# Patient Record
Sex: Female | Born: 1950 | Race: White | Hispanic: No | Smoking: Current every day smoker
Health system: Southern US, Community
[De-identification: ages and names within clinical notes are randomized; demographics above are authoritative.]

## PROBLEM LIST (undated history)

## (undated) DIAGNOSIS — E785 Hyperlipidemia, unspecified: Secondary | ICD-10-CM

## (undated) DIAGNOSIS — I10 Essential (primary) hypertension: Secondary | ICD-10-CM

## (undated) DIAGNOSIS — D369 Benign neoplasm, unspecified site: Secondary | ICD-10-CM

## (undated) DIAGNOSIS — Z808 Family history of malignant neoplasm of other organs or systems: Secondary | ICD-10-CM

## (undated) DIAGNOSIS — Z800 Family history of malignant neoplasm of digestive organs: Secondary | ICD-10-CM

## (undated) DIAGNOSIS — Z86010 Personal history of colonic polyps: Secondary | ICD-10-CM

## (undated) DIAGNOSIS — Z803 Family history of malignant neoplasm of breast: Secondary | ICD-10-CM

## (undated) DIAGNOSIS — E0590 Thyrotoxicosis, unspecified without thyrotoxic crisis or storm: Secondary | ICD-10-CM

## (undated) HISTORY — PX: COLONOSCOPY W/ POLYPECTOMY: SHX1380

## (undated) HISTORY — DX: Personal history of colonic polyps: Z86.010

## (undated) HISTORY — DX: Essential (primary) hypertension: I10

## (undated) HISTORY — DX: Family history of malignant neoplasm of digestive organs: Z80.0

## (undated) HISTORY — PX: WISDOM TOOTH EXTRACTION: SHX21

## (undated) HISTORY — DX: Hyperlipidemia, unspecified: E78.5

## (undated) HISTORY — DX: Benign neoplasm, unspecified site: D36.9

## (undated) HISTORY — PX: WRIST FRACTURE SURGERY: SHX121

## (undated) HISTORY — DX: Family history of malignant neoplasm of other organs or systems: Z80.8

## (undated) HISTORY — DX: Family history of malignant neoplasm of breast: Z80.3

## (undated) HISTORY — DX: Thyrotoxicosis, unspecified without thyrotoxic crisis or storm: E05.90

---
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
## 1993-06-13 HISTORY — PX: VAGINAL HYSTERECTOMY: SUR661

## 1999-02-16 ENCOUNTER — Other Ambulatory Visit: Admission: RE | Admit: 1999-02-16 | Discharge: 1999-02-16 | Payer: Self-pay | Admitting: Obstetrics and Gynecology

## 2000-04-11 ENCOUNTER — Other Ambulatory Visit: Admission: RE | Admit: 2000-04-11 | Discharge: 2000-04-11 | Payer: Self-pay | Admitting: Obstetrics and Gynecology

## 2001-04-27 ENCOUNTER — Other Ambulatory Visit: Admission: RE | Admit: 2001-04-27 | Discharge: 2001-04-27 | Payer: Self-pay | Admitting: Obstetrics and Gynecology

## 2001-05-15 ENCOUNTER — Ambulatory Visit (HOSPITAL_COMMUNITY): Admission: RE | Admit: 2001-05-15 | Discharge: 2001-05-15 | Payer: Self-pay | Admitting: Obstetrics and Gynecology

## 2001-05-15 ENCOUNTER — Encounter: Payer: Self-pay | Admitting: Obstetrics and Gynecology

## 2002-04-29 ENCOUNTER — Other Ambulatory Visit: Admission: RE | Admit: 2002-04-29 | Discharge: 2002-04-29 | Payer: Self-pay | Admitting: Obstetrics and Gynecology

## 2002-05-24 ENCOUNTER — Encounter: Payer: Self-pay | Admitting: Obstetrics and Gynecology

## 2002-05-24 ENCOUNTER — Ambulatory Visit (HOSPITAL_COMMUNITY): Admission: RE | Admit: 2002-05-24 | Discharge: 2002-05-24 | Payer: Self-pay | Admitting: Obstetrics and Gynecology

## 2003-05-28 ENCOUNTER — Other Ambulatory Visit: Admission: RE | Admit: 2003-05-28 | Discharge: 2003-05-28 | Payer: Self-pay | Admitting: Obstetrics and Gynecology

## 2004-05-27 ENCOUNTER — Other Ambulatory Visit: Admission: RE | Admit: 2004-05-27 | Discharge: 2004-05-27 | Payer: Self-pay | Admitting: Obstetrics and Gynecology

## 2005-06-02 ENCOUNTER — Other Ambulatory Visit: Admission: RE | Admit: 2005-06-02 | Discharge: 2005-06-02 | Payer: Self-pay | Admitting: Obstetrics and Gynecology

## 2009-08-25 ENCOUNTER — Encounter (INDEPENDENT_AMBULATORY_CARE_PROVIDER_SITE_OTHER): Payer: Self-pay | Admitting: *Deleted

## 2009-08-26 ENCOUNTER — Telehealth: Payer: Self-pay | Admitting: Internal Medicine

## 2009-08-26 ENCOUNTER — Ambulatory Visit: Payer: Self-pay | Admitting: Internal Medicine

## 2009-09-08 ENCOUNTER — Ambulatory Visit: Payer: Self-pay | Admitting: Internal Medicine

## 2009-09-14 ENCOUNTER — Encounter: Payer: Self-pay | Admitting: Internal Medicine

## 2009-11-30 ENCOUNTER — Encounter (HOSPITAL_COMMUNITY): Admission: RE | Admit: 2009-11-30 | Discharge: 2010-02-16 | Payer: Self-pay | Admitting: Endocrinology

## 2010-07-04 ENCOUNTER — Encounter: Payer: Self-pay | Admitting: Endocrinology

## 2010-07-15 NOTE — Progress Notes (Signed)
Summary: Reglan given waith Moviprep  Dr. Leone Payor, I ordered [name] 2 Reglan 10mg  in case she needed them with her Movi Prep.  She wanted OsmoPrep but decided to try the Moviprep due to the FDA warning.  Dawn Hopkins

## 2010-07-15 NOTE — Miscellaneous (Signed)
Summary: [facility] Previsit/prep  Clinical Lists Changes  Medications: Added new medication of MOVIPREP 100 GM  SOLR (PEG-KCL-NACL-NASULF-NA ASC-C) As per prep instructions. - Signed Added new medication of METOCLOPRAMIDE HCL 10 MG  TABS (METOCLOPRAMIDE HCL) As per prep instructions. - Signed Rx of MOVIPREP 100 GM  SOLR (PEG-KCL-NACL-NASULF-NA ASC-C) As per prep instructions.;  #1 x 0;  Signed;  Entered by: Wyona Almas RN;  Authorized by: Iva Boop MD, [facility];  Method used: Electronically to [facility] [address] [place], [place], [place]  [address], Ph: [phone], Fax: [phone] Rx of METOCLOPRAMIDE HCL 10 MG  TABS (METOCLOPRAMIDE HCL) As per prep instructions.;  #2 x 0;  Signed;  Entered by: Wyona Almas RN;  Authorized by: Iva Boop MD, [facility];  Method used: Electronically to [facility] [address] [place], [place], [place]  [address], Ph: [phone], Fax: [phone] Observations: Added new observation of NKA: T (08/26/2009 16:05)    Prescriptions: METOCLOPRAMIDE HCL 10 MG  TABS (METOCLOPRAMIDE HCL) As per prep instructions.  #2 x 0   Entered by:   Wyona Almas RN   Authorized by:   Iva Boop MD, [facility]   Signed by:   Wyona Almas RN on 08/26/2009   Method used:   Electronically to        [facility]* (retail)       [address] [place], [place]  [address]       Ph: [phone]       Fax: [phone]   RxID:   [id number] MOVIPREP 100 GM  SOLR (PEG-KCL-NACL-NASULF-NA ASC-C) As per prep instructions.  #1 x 0   Entered by:   Wyona Almas RN   Authorized by:   Iva Boop MD, [facility]   Signed by:   Wyona Almas RN on 08/26/2009   Method used:   Electronically to        [facility]* (retail)       [address] [place], [place]  [address]       Ph: [phone]       Fax: [phone]   RxID:   [id number]

## 2010-07-15 NOTE — Letter (Signed)
Summary: Patient Notice- Colon Biospy Results  [facility]  [address] [place], [place] [address]   Phone: [phone]  Fax: [phone]        September 14, 2009 MRN: [id number]    Dawn Hopkins [address] [place], [place]  [address]    Dear [name],  I am pleased to inform you that the biopsies taken during your recent colonoscopy did not show any evidence of cancer upon pathologic examination.  You had one tiny polyp that was removed and was hyperplastic, having no significant cancer risk.  There was a suspected polyp in the right side of your colon also classified as a hyperplastic polyp. It is thought that hyperplastic polyps on this side of the colon may have cancer risk over time. I was not able to remove that polyp.    I would like to see you in the office in one year so that may discuss options for follow-up of this polyp that remains. You have had polyps like this before and so far you have been without colon cancer, so I do not think we need to repeat a colonoscopy right now as the significance and need to remove this poly is actually unclear.  We will place you on our list to call for an appointment in about one year and I advise you to save this letter and mark your 2012 calendar to remember to see me in April 2012.  Please call us if you are having persistent problems or have questions about your condition that have not been fully answered at this time.  Sincerely,  Iva Boop MD, [facility]   This letter has been electronically signed by your physician.  Appended Document: Patient Notice- Colon Biospy Results Letter mailed 4.5.11

## 2010-07-15 NOTE — Procedures (Signed)
Summary: Colonoscopy  Patient: [name] Note: All result statuses are Final unless otherwise noted.  Tests: (1) Colonoscopy (COL)   COL Colonoscopy           DONE     [facility]     [address]     [place], [place]  [address]           COLONOSCOPY PROCEDURE REPORT           PATIENT:  Dawn Hopkins, Dawn Hopkins  MR#:  [id number]     BIRTHDATE:  1950/07/01, 59 yrs. old  GENDER:  female     ENDOSCOPIST:  Iva Boop, MD, [facility]     REF. 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  Digital rectal exam was performed and     revealed no abnormalities.   The LB CF-[id number] endoscope     was introduced through the anus and advanced to the cecum, which     was identified by both the appendix and ileocecal valve, without     limitations.  The quality of the prep was good, using MoviPrep.     The instrument was then slowly withdrawn as the colon was fully     examined. Insertion: 7:04 minutes withdrawal: 15:32 minutes     <<PROCEDUREIMAGES>>           FINDINGS:  A diminutive polyp was found in the sigmoid colon. It     was 5 mm in size. Polyp was snared without cautery. Retrieval was     successful. s Abnormal appearing mucosa in the mid transverse     colon. ? polyp, flat up to 15 mm on a fold, unable to snare so     biopsied Multiple biopsies were obtained and sent to pathology.     Scattered diverticula were found in the sigmoid colon.  This was     otherwise a normal examination of the  colon.   Retroflexed views     in the rectum revealed internal and external hemorrhoids.    The     scope was then withdrawn from the patient and the procedure     completed.           COMPLICATIONS:  None     ENDOSCOPIC IMPRESSION:     1) 5 mm diminutive polyp in the sigmoid colon - removed     2) Abnormal mucosa in the mid transverse colon - ? flat polyp -     biopsied     3) Diverticula, scattered in the sigmoid colon     4) Internal and external hemorrhoids     5) Otherwise normal examination           6) prior adenomas 2004 and 2005           REPEAT  EXAM:  In for Colonoscopy, pending biopsy results.           Iva Boop, MD, [facility]           CC:  Montey Hora, MD     Harold Hedge, MD     The Patient           n.     Rosalie Doctor:   Iva Boop at 09/08/2009 02:22 PM           Aurea Graff, [id number]  Note: An exclamation mark (!) indicates a result that was not dispersed into the flowsheet. Document Creation Date: 09/08/2009 2:23 PM _______________________________________________________________________  (1) Order result status: Final Collection or observation date-time: 09/08/2009 14:12 Requested date-time:  Receipt date-time:  Reported date-time:  Referring Physician:   Ordering Physician: Stan Head [phone]) Specimen Source:  Source: Launa Grill Order Number: [id number] Lab site:   Appended Document: Colonoscopy     Procedures Next Due Date:    Colonoscopy: 09/2010

## 2010-07-15 NOTE — Letter (Signed)
Summary: [facility] Instructions  [facility]  [address] [place], [place] [address]   Phone: [phone]  Fax: [phone]       Dawn Hopkins    01/16/1951    MRN: [id number]        Procedure Day Dorna Bloom:  [name]  09/08/09     Arrival Time:  12:30PM     Procedure Time:  1:30PM     Location of Procedure:                    _ X_  [facility] (4th Floor)                        PREPARATION FOR COLONOSCOPY WITH MOVIPREP   Starting 5 days prior to your procedure 09/03/09 do not eat nuts, seeds, popcorn, corn, beans, peas,  salads, or any raw vegetables.  Do not take any fiber supplements (e.g. Metamucil, Citrucel, and Benefiber).  THE DAY BEFORE YOUR PROCEDURE         DATE: 09/07/09  DAY: MONDAY  1.  Drink clear liquids the entire day-NO SOLID FOOD  2.  Do not drink anything colored red or purple.  Avoid juices with pulp.  No orange juice.  3.  Drink at least 64 oz. (8 glasses) of fluid/clear liquids during the day to prevent dehydration and help the prep work efficiently.  CLEAR LIQUIDS INCLUDE: Water Jello Ice Popsicles Tea (sugar ok, no milk/cream) Powdered fruit flavored drinks Coffee (sugar ok, no milk/cream) Gatorade Juice: apple, white grape, white cranberry  Lemonade Clear bullion, consomm, broth Carbonated beverages (any kind) Strained chicken noodle soup Hard Candy                             4.  In the morning, mix first dose of MoviPrep solution:    Empty 1 Pouch A and 1 Pouch B into the disposable container    Add lukewarm drinking water to the top line of the container. Mix to dissolve    Refrigerate (mixed solution should be used within 24 hrs)  Optional:  Take one Metacloprimide/Reglan at 4:30 PM to prevent nausea.  5.  Begin drinking the prep at 5:00 p.m. The MoviPrep container is divided by 4 marks.   Every 15 minutes drink the solution down to the next mark (approximately 8 oz) until the full liter is complete.     6.  Follow completed prep with 16 oz of clear liquid of your choice (Nothing red or purple).  Continue to drink clear liquids until bedtime.  7.  Before going to bed, mix second dose of MoviPrep solution:    Empty 1 Pouch A and 1 Pouch B into the disposable container    Add lukewarm drinking water to the top line of the container. Mix to dissolve    Refrigerate  THE DAY OF YOUR PROCEDURE      DATE: 09/08/09  DAY: TUESDAY  Optional:  Take one Metacloprimide/Reglan at 8:00AM to prevent nausea.  Beginning at 8:30a.m. (5 hours before procedure):         1. Every 15 minutes, drink the solution down to the next mark (approx 8 oz) until the full liter is complete.  2. Follow completed prep with 16 oz. of clear liquid of your choice.    3. You may drink clear liquids until 11:30AM (2 HOURS BEFORE PROCEDURE).   MEDICATION INSTRUCTIONS  Unless otherwise instructed, you should take regular prescription medications with a small sip of water   as early as possible the morning of your procedure.         OTHER INSTRUCTIONS  You will need a responsible adult at least 60 years of age to accompany you and drive you home.   This person must remain in the waiting room during your procedure.  Wear loose fitting clothing that is easily removed.  Leave jewelry and other valuables at home.  However, you may wish to bring a book to read or  an iPod/MP3 player to listen to music as you wait for your procedure to start.  Remove all body piercing jewelry and leave at home.  Total time from sign-in until discharge is approximately 2-3 hours.  You should go home directly after your procedure and rest.  You can resume normal activities the  day after your procedure.  The day of your procedure you should not:   Drive   Make legal decisions   Operate machinery   Drink alcohol   Return to work  You will receive specific instructions about eating, activities and medications before you  leave.    The above instructions have been reviewed and explained to me by   Wyona Almas RN  August 26, 2009 4:52 PM     I fully understand and can verbalize these instructions _____________________________ Date _________

## 2010-08-26 ENCOUNTER — Encounter: Payer: Self-pay | Admitting: Internal Medicine

## 2010-08-31 NOTE — Letter (Signed)
Summary: Colonoscopy-Changed to Office Visit Letter   [facility]  [address]   [place], [place] [address]   Phone: [phone]  Fax: [phone]      August 26, 2010 MRN: [id number]   Dawn Hopkins [address] [place], [place]  [address]   Dear [name],   According to our records, it is time for you to schedule a Colonoscopy. However, after reviewing your medical record, I feel that an office visit would be most appropriate to more completely evaluate you and determine your need for a repeat procedure.  Please call [phone] (option #2) at your convenience to schedule an office visit. If you have any questions, concerns, or feel that this letter is in error, we would appreciate your call.   Sincerely,   Stan Head, M.D.   [facility] Gastroenterology Division [phone]

## 2010-11-26 ENCOUNTER — Ambulatory Visit (INDEPENDENT_AMBULATORY_CARE_PROVIDER_SITE_OTHER): Payer: BC Managed Care – PPO | Admitting: Internal Medicine

## 2010-11-26 ENCOUNTER — Encounter: Payer: Self-pay | Admitting: Internal Medicine

## 2010-11-26 DIAGNOSIS — D1391 Familial adenomatous polyposis: Secondary | ICD-10-CM | POA: Insufficient documentation

## 2010-11-26 DIAGNOSIS — Z860100 Personal history of colon polyps, unspecified: Secondary | ICD-10-CM

## 2010-11-26 DIAGNOSIS — D126 Benign neoplasm of colon, unspecified: Secondary | ICD-10-CM

## 2010-11-26 HISTORY — DX: Benign neoplasm of colon, unspecified: D12.6

## 2010-11-26 HISTORY — DX: Personal history of colonic polyps: Z86.010

## 2010-11-26 HISTORY — DX: Personal history of colon polyps, unspecified: Z86.0100

## 2010-11-26 NOTE — Progress Notes (Signed)
This is a 60 year old white woman that has had several colonoscopies and polypectomies. On the last one, approximately a year ago there was a suspected 15 mm flat polyp in the right colon. The pathology from biopsies came back as a hyperplastic polyp. It was not entirely clear this was a true polyp but it might have been. I was unable to snare it. I had asked her to come back in about a year to discuss followup. She is inclined to wait as long as I think she can before she has a followup colonoscopy for this. She has no active GI complaints. She is monitoring her thyroid, switching endocrinologists at this time, while she awaits starting Synthroid after radioactive ablation of her thyroid for hyperthyroidism.

## 2010-11-26 NOTE — Patient Instructions (Signed)
This office will plan to send you a reminder letter in October 2012 about having a colonoscopy to follow-up on the polyp in question as we discussed. Iva Boop, MD, [facility]

## 2010-11-26 NOTE — Assessment & Plan Note (Addendum)
I explained as best I could help she had what is possibly a large hyperplastic polyp in the right colon that I could not remove last year. Timing of followup is not entirely clear. She is following up after radioactive iodine ablation of her thyroid and is switching endocrinologists and is about to start Synthroid she thinks. We agreed to put this off for a few months at least, I think that's fine from a medical standpoint and we will send her a recall letter in October. Would plan for a colonoscopy at the hospital were argon plasma coagulator is available. I did explain that sometimes there is this interpretation this may not really even be a true polyp but need to follow-up. She has had TV adenoma of cecum and a rectal adenoma previously.

## 2010-11-29 ENCOUNTER — Encounter: Payer: Self-pay | Admitting: Internal Medicine

## 2011-01-19 ENCOUNTER — Telehealth: Payer: Self-pay | Admitting: Gastroenterology

## 2011-01-19 NOTE — Telephone Encounter (Signed)
Message copied by Bernita Buffy on Wed Jan 19, 2011  8:52 AM ------      Message from: Iva Boop      Created: Tue Jan 18, 2011  9:31 PM      Regarding: recall       Please double check her colon recall date and let me know

## 2011-01-19 NOTE — Telephone Encounter (Signed)
Colon recall date is October 2012.

## 2011-06-16 ENCOUNTER — Other Ambulatory Visit: Payer: Self-pay | Admitting: Dermatology

## 2011-07-22 ENCOUNTER — Telehealth: Payer: Self-pay | Admitting: Internal Medicine

## 2011-07-22 NOTE — Telephone Encounter (Signed)
Per the June office note she was to be in for a colon recall with [facility] for large hyperplastic polyp.  She still does not want this scheduled at this time, but has asked that I send her a letter now so she can have it to remind herself to call and schedule for late Spring or early summer.  I will send the recall letter today.  Dr Leone Payor looks like the recall was not sent in October.  See phone note from 01/19/12.  I encouraged the patient to schedule for Feb hospital week, "I am not ready to do that anytime soon".  Recall letter sent today.

## 2011-09-01 ENCOUNTER — Encounter: Payer: Self-pay | Admitting: Internal Medicine

## 2011-09-01 NOTE — Telephone Encounter (Signed)
This encounter was created in error - please disregard.

## 2011-11-30 ENCOUNTER — Telehealth: Payer: Self-pay

## 2011-11-30 NOTE — Telephone Encounter (Signed)
Message copied by Annett Fabian on Wed Nov 30, 2011  9:27 AM ------      Message from: Iva Boop      Created: Tue Nov 29, 2011  3:40 PM      Regarding: still needs colonoscopy       Please contact her by phone about scheduling follow-up colonoscopy (hospital with [facility] available)

## 2011-11-30 NOTE — Telephone Encounter (Signed)
Left message for patient to call back to discuss colon at [facility] with [facility]

## 2011-12-01 NOTE — Telephone Encounter (Signed)
I have attempted to reach the patient and there is no return call from the patient.  I have left her another message and mailed her a letters asking her to call me to discuss and schedule

## 2011-12-13 ENCOUNTER — Telehealth: Payer: Self-pay | Admitting: Internal Medicine

## 2011-12-13 NOTE — Telephone Encounter (Signed)
Left message for patient to call back to discuss scheduling a colonoscopy with [facility] @ [facility] for the week of 02/06/12-02/09/12

## 2011-12-14 ENCOUNTER — Other Ambulatory Visit: Payer: Self-pay

## 2011-12-14 NOTE — Telephone Encounter (Signed)
Patient is scheduled for colon with [facility] @ [facility] for 02/08/12 0830.  She will come for a pre-visit on 01/25/12 4:30

## 2012-01-17 ENCOUNTER — Other Ambulatory Visit: Payer: Self-pay

## 2012-01-25 ENCOUNTER — Encounter: Payer: Self-pay | Admitting: Internal Medicine

## 2012-01-25 ENCOUNTER — Ambulatory Visit (AMBULATORY_SURGERY_CENTER): Payer: 59 | Admitting: *Deleted

## 2012-01-25 VITALS — Ht 64.0 in | Wt 160.9 lb

## 2012-01-25 DIAGNOSIS — Z1211 Encounter for screening for malignant neoplasm of colon: Secondary | ICD-10-CM

## 2012-01-25 DIAGNOSIS — Z860100 Personal history of colon polyps, unspecified: Secondary | ICD-10-CM

## 2012-01-25 MED ORDER — MOVIPREP 100 G PO SOLR: ORAL | Status: DC

## 2012-02-08 ENCOUNTER — Encounter (HOSPITAL_COMMUNITY)
Admission: RE | Disposition: A | Discharge status: Home / Self Care | Payer: Self-pay | Source: Ambulatory Visit | Attending: Internal Medicine

## 2012-02-08 ENCOUNTER — Encounter (HOSPITAL_COMMUNITY): Payer: Self-pay | Admitting: *Deleted

## 2012-02-08 ENCOUNTER — Ambulatory Visit (HOSPITAL_COMMUNITY)
Admission: RE | Admit: 2012-02-08 | Discharge: 2012-02-08 | Disposition: A | Discharge status: Home / Self Care | Payer: 59 | Source: Ambulatory Visit | Attending: Internal Medicine | Admitting: Internal Medicine

## 2012-02-08 DIAGNOSIS — F17200 Nicotine dependence, unspecified, uncomplicated: Secondary | ICD-10-CM | POA: Insufficient documentation

## 2012-02-08 DIAGNOSIS — Z86010 Personal history of colonic polyps: Secondary | ICD-10-CM

## 2012-02-08 DIAGNOSIS — Z09 Encounter for follow-up examination after completed treatment for conditions other than malignant neoplasm: Secondary | ICD-10-CM | POA: Insufficient documentation

## 2012-02-08 DIAGNOSIS — K5730 Diverticulosis of large intestine without perforation or abscess without bleeding: Secondary | ICD-10-CM | POA: Insufficient documentation

## 2012-02-08 DIAGNOSIS — Z1211 Encounter for screening for malignant neoplasm of colon: Secondary | ICD-10-CM

## 2012-02-08 DIAGNOSIS — D126 Benign neoplasm of colon, unspecified: Secondary | ICD-10-CM | POA: Insufficient documentation

## 2012-02-08 HISTORY — PX: COLONOSCOPY: SHX5424

## 2012-02-08 SURGERY — COLONOSCOPY
Anesthesia: Moderate Sedation

## 2012-02-08 MED ORDER — FENTANYL CITRATE 0.05 MG/ML IJ SOLN
INTRAMUSCULAR | Status: DC | PRN
  Administered 2012-02-08 (×3): 25 ug via INTRAVENOUS

## 2012-02-08 MED ORDER — MIDAZOLAM HCL 10 MG/2ML IJ SOLN
INTRAMUSCULAR | Status: DC | PRN
  Administered 2012-02-08 (×3): 2 mg via INTRAVENOUS

## 2012-02-08 MED ORDER — DIPHENHYDRAMINE HCL 50 MG/ML IJ SOLN
INTRAMUSCULAR | Status: AC
  Filled 2012-02-08: qty 1

## 2012-02-08 MED ORDER — FENTANYL CITRATE 0.05 MG/ML IJ SOLN
INTRAMUSCULAR | Status: AC
  Filled 2012-02-08: qty 4

## 2012-02-08 MED ORDER — SODIUM CHLORIDE 0.9 % IV SOLN
INTRAVENOUS | Status: DC
  Administered 2012-02-08: 500 mL via INTRAVENOUS

## 2012-02-08 MED ORDER — MIDAZOLAM HCL 10 MG/2ML IJ SOLN
INTRAMUSCULAR | Status: AC
  Filled 2012-02-08: qty 4

## 2012-02-08 NOTE — H&P (Signed)
  Cc: Hx colon polyps  HPI:  No symptoms Here for colon poly follow-up  No Known Allergies @[facility] @ Past Medical History  Diagnosis Date  . Adenomatous polyps 11/2002, 8, 2005  . 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 Vaginal hysterectomy   . Colonoscopy w/ polypectomy 6/04, 8/05, 3/11    1cm TV adenoma cecum; 7 mm rectal adenoma; large hyperplastic polyp not removed from transverse  . Tonsillectomy and adenoidectomy   . Wisdom tooth extraction    History   Social History  . Marital Status: Divorced    Spouse Name: N/A    Number of Children: N/A  . Years of Education: N/A   Social History Main Topics  . Smoking status: Current Everyday Smoker -- 1.0 packs/day    Types: Cigarettes  . Smokeless tobacco: Never Used  . Alcohol Use: Yes     rare  . Drug Use: No  . Sexually Active: None   Other Topics Concern  . None   Social History Narrative  . None   Family History  Problem Relation Age of Onset  . Breast cancer Mother   . Alzheimer's disease Father   . Colon cancer Neg Hx   . Stomach cancer Neg Hx       PE:  General:  NAD Eyes:   anicteric Lungs:  clear Heart:  S1S2 no rubs, murmurs or gallops Abdomen:  soft and nontender, BS+   Ass/Plan  Hx colon polyps  For colonoscopy  .

## 2012-02-08 NOTE — Op Note (Signed)
[facility] [address] [place], [address]   COLONOSCOPY PROCEDURE REPORT  PATIENT: Dawn Hopkins, Dawn Hopkins  MR#: [id number] BIRTHDATE: February 02, 1951 , 61  yrs. old GENDER: Female ENDOSCOPIST: Iva Boop, MD, [facility] REFERRED BY: PROCEDURE DATE:  02/08/2012 PROCEDURE:   Colonoscopy with cold biopsy polypectomy ASA CLASS:   Class I INDICATIONS:screening and surveillance,personal history of colonic polyps. MEDICATIONS: Fentanyl 75 mcg IV and Versed 6 mg IV  DESCRIPTION OF PROCEDURE:   After the risks benefits and alternatives of the procedure were thoroughly explained, informed consent was obtained.  A digital rectal exam revealed no abnormalities of the rectum.   The     endoscope was introduced through the anus and advanced to the cecum, which was identified by both the appendix and ileocecal valve. No adverse events experienced.   The quality of the prep was excellent, using MoviPrep  The instrument was then slowly withdrawn as the colon was fully examined.      COLON FINDINGS: Three diminutive sessile polyps were found at the splenic flexure.  A polypectomy was performed with cold forceps. The resection was complete and the polyp tissue was completely retrieved.   A sessile polyp was found in the descending colon.  A polypectomy was performed with a cold snare.  The resection was complete and the polyp tissue was completely retrieved.   A sessile polyp measuring 3 mm in size was found in the rectum.  A polypectomy was performed with cold forceps.  The resection was complete and the polyp tissue was completely retrieved.   Mild diverticulosis was noted in the sigmoid colon.   The colon mucosa was otherwise normal.   Hypertrophic anal papilla was found in the anal canal.  Retroflexed views in right colon and rectum revealed no abnormalities. The time to cecum=5 minutes 0 seconds. Withdrawal time=7 minutes 0 seconds.  The scope was withdrawn and the  procedure completed. COMPLICATIONS: There were no complications.  ENDOSCOPIC IMPRESSION: 1.   Three diminutive sessile polyps were found at the splenic flexure; polypectomy was performed with cold forceps 2.   Sessile polyp was found in the descending colon; polypectomy was performed with a cold snare 3.   Sessile polyp measuring 3 mm in size was found in the rectum; polypectomy was performed with cold forceps 4.   Mild diverticulosis was noted in the sigmoid colon 5.   The colon mucosa was otherwise normal 6.   Hypertrophic anal papilla in the anal canal  RECOMMENDATIONS: Timing of repeat colonoscopy will be determined by pathology findings and her personal history of colon polyps.   eSigned:  Iva Boop, MD, [facility] 02/08/2012 9:28 AM   cc: Harold Hedge, MD and The Patient   PATIENT NAME:  Alyia, Lacerte MR#: [id number]

## 2012-02-09 ENCOUNTER — Encounter (HOSPITAL_COMMUNITY): Payer: Self-pay

## 2012-02-09 ENCOUNTER — Encounter (HOSPITAL_COMMUNITY): Payer: Self-pay | Admitting: Internal Medicine

## 2012-02-12 ENCOUNTER — Encounter: Payer: Self-pay | Admitting: Internal Medicine

## 2012-02-12 NOTE — Progress Notes (Signed)
Quick Note:  5 adenomas - repeat colonoscopy 3 years 02/2015 ______

## 2012-08-03 ENCOUNTER — Telehealth: Payer: Self-pay | Admitting: Internal Medicine

## 2012-08-03 ENCOUNTER — Encounter: Payer: Self-pay | Admitting: Gastroenterology

## 2012-08-03 ENCOUNTER — Ambulatory Visit (INDEPENDENT_AMBULATORY_CARE_PROVIDER_SITE_OTHER): Payer: 59 | Admitting: Gastroenterology

## 2012-08-03 VITALS — BP 120/84 | HR 84 | Ht 63.78 in | Wt 165.4 lb

## 2012-08-03 DIAGNOSIS — K625 Hemorrhage of anus and rectum: Secondary | ICD-10-CM | POA: Insufficient documentation

## 2012-08-03 MED ORDER — HYDROCORTISONE ACETATE 25 MG RE SUPP: RECTAL | Status: DC

## 2012-08-03 NOTE — Patient Instructions (Addendum)
You have been given a separate informational sheet regarding your tobacco use, the importance of quitting and local resources to help you quit.  We sent a prescription for the [facility] suppositories to [facility].  Call us if your symptoms worsen or you have any other problems.

## 2012-08-03 NOTE — Progress Notes (Signed)
08/03/2012 Dawn Hopkins [id number] 1950-11-21   History of Present Illness: Patient is a pleasant 62 year old female who is a patient of Dr. Marvell Fuller.  She presents to our office today for complaints of rectal bleeding.  Had a BM last night with BRB in toilet bowl and on TP.  No abdominal pain or rectal discomfort.  No blood or BM overnight, but had another BM this AM with some blood as well.  Last colonoscopy was 01/2012 at which time she was found to have 5 adenomas, which were removed.  Recommended repeat colonoscopy in 3 years.  Mild diverticulosis in the sigmoid colon and hypertrophic anal papilla in the anal canal were also noted.  Current Medications, Allergies, Past Medical History, Past Surgical History, Family History and Social History were reviewed in [employer] record.   Physical Exam: BP 120/84  Pulse 84  Ht 5' 3.78" (1.62 m)  Wt 165 lb 6 oz (75.014 kg)  BMI 28.58 kg/m2 General: Well developed, white female in no acute distress Head: Normocephalic and atraumatic Eyes:  sclerae anicteric, conjunctiva pink  Ears: Normal auditory acuity Lungs: Clear throughout to auscultation Heart: Regular rate and rhythm Abdomen: Soft, non-tender and non-distended. No masses, no hepatomegaly. Normal bowel sounds. Rectal: External hemorrhoid seen without bleeding.  DRE revealed soft stool in rectal vault; no definitive internal hemorrhoid felt.  Blood on exam glove darker in color. Musculoskeletal: Symmetrical with no gross deformities  Extremities: No edema  Neurological: Alert oriented x 4, grossly nonfocal Psychological:  Alert and cooperative. Normal mood and affect  Assessment and Recommendations: -Rectal bleeding:  Likely secondary to internal hemorrhoid vs small volume diverticular bleed.  Will give anusol suppositories to use at bedtime.  Call if symptoms worsen or other issues occur.

## 2012-08-03 NOTE — Telephone Encounter (Signed)
Patient with two episodes of moderate amount of bright red blood with a BM.  She denies abdominal pain, fever, or other GI complaints today.  She will come in for evaluation with Doug Sou, PA at 3:00 today.

## 2012-08-08 NOTE — Progress Notes (Signed)
Agree with Dawn Hopkins's management. 

## 2013-10-25 ENCOUNTER — Other Ambulatory Visit: Payer: Self-pay | Admitting: Physician Assistant

## 2014-02-13 ENCOUNTER — Encounter: Payer: Self-pay | Admitting: Internal Medicine

## 2014-10-16 ENCOUNTER — Other Ambulatory Visit (HOSPITAL_COMMUNITY): Payer: Self-pay | Admitting: Obstetrics and Gynecology

## 2014-10-17 LAB — CYTOLOGY - PAP

## 2014-12-16 ENCOUNTER — Telehealth: Payer: Self-pay | Admitting: Internal Medicine

## 2014-12-16 NOTE — Telephone Encounter (Signed)
Patient is asking if her colonoscopy has to be scheduled at the hospital. (last one was done at hospital) Please, advise.

## 2014-12-17 NOTE — Telephone Encounter (Signed)
Spoke to pt she states she will call back to schedule in a week or two.

## 2014-12-17 NOTE — Telephone Encounter (Signed)
Ok to schedule pt for colon in the [facility] in September per Dr. Carlean Purl.

## 2014-12-17 NOTE — Telephone Encounter (Signed)
[facility] this time

## 2015-01-06 ENCOUNTER — Encounter: Payer: Self-pay | Admitting: Internal Medicine

## 2015-03-05 ENCOUNTER — Ambulatory Visit (AMBULATORY_SURGERY_CENTER): Payer: Self-pay | Admitting: *Deleted

## 2015-03-05 VITALS — Ht 65.0 in | Wt 166.2 lb

## 2015-03-05 DIAGNOSIS — Z86010 Personal history of colonic polyps: Secondary | ICD-10-CM

## 2015-03-05 NOTE — Progress Notes (Signed)
No allergies to eggs or soy. No problems with anesthesia.  Pt given Emmi instructions for colonoscopy  No oxygen use  No diet drug use  

## 2015-03-19 ENCOUNTER — Encounter: Payer: Self-pay | Admitting: Internal Medicine

## 2015-03-19 ENCOUNTER — Ambulatory Visit (AMBULATORY_SURGERY_CENTER): Payer: BLUE CROSS/BLUE SHIELD | Admitting: Internal Medicine

## 2015-03-19 VITALS — BP 127/110 | HR 52 | Temp 97.6°F | Resp 26 | Ht 65.0 in | Wt 166.0 lb

## 2015-03-19 DIAGNOSIS — Z86010 Personal history of colonic polyps: Secondary | ICD-10-CM | POA: Diagnosis not present

## 2015-03-19 DIAGNOSIS — D123 Benign neoplasm of transverse colon: Secondary | ICD-10-CM | POA: Diagnosis not present

## 2015-03-19 DIAGNOSIS — D124 Benign neoplasm of descending colon: Secondary | ICD-10-CM

## 2015-03-19 DIAGNOSIS — K635 Polyp of colon: Secondary | ICD-10-CM | POA: Diagnosis not present

## 2015-03-19 DIAGNOSIS — D122 Benign neoplasm of ascending colon: Secondary | ICD-10-CM | POA: Diagnosis not present

## 2015-03-19 MED ORDER — SODIUM CHLORIDE 0.9 % IV SOLN: 500.0000 mL | INTRAVENOUS | Status: DC

## 2015-03-19 NOTE — Patient Instructions (Addendum)
I found and removed 19 small polyps.  I will let you know results and when to have another colonoscopy by mail. It may be as soon as 1 year this time.  I appreciate the opportunity to care for you. Gatha Mayer, MD, [facility]  Discharge instructions given. Handouts on polyps and diverticulosis. Resume previous medications. YOU HAD AN ENDOSCOPIC PROCEDURE TODAY AT [facility]:   Refer to the procedure report that was given to you for any specific questions about what was found during the examination.  If the procedure report does not answer your questions, please call your gastroenterologist to clarify.  If you requested that your care partner not be given the details of your procedure findings, then the procedure report has been included in a sealed envelope for you to review at your convenience later.  YOU SHOULD EXPECT: Some feelings of bloating in the abdomen. Passage of more gas than usual.  Walking can help get rid of the air that was put into your GI tract during the procedure and reduce the bloating. If you had a lower endoscopy (such as a colonoscopy or flexible sigmoidoscopy) you may notice spotting of blood in your stool or on the toilet paper. If you underwent a bowel prep for your procedure, you may not have a normal bowel movement for a few days.  Please Note:  You might notice some irritation and congestion in your nose or some drainage.  This is from the oxygen used during your procedure.  There is no need for concern and it should clear up in a day or so.  SYMPTOMS TO REPORT IMMEDIATELY:   Following lower endoscopy (colonoscopy or flexible sigmoidoscopy):  Excessive amounts of blood in the stool  Significant tenderness or worsening of abdominal pains  Swelling of the abdomen that is new, acute  Fever of 100F or higher   For urgent or emergent issues, a gastroenterologist can be reached at any hour by calling [phone].   DIET: Your first meal  following the procedure should be a small meal and then it is ok to progress to your normal diet. Heavy or fried foods are harder to digest and may make you feel nauseous or bloated.  Likewise, meals heavy in dairy and vegetables can increase bloating.  Drink plenty of fluids but you should avoid alcoholic beverages for 24 hours.  ACTIVITY:  You should plan to take it easy for the rest of today and you should NOT DRIVE or use heavy machinery until tomorrow (because of the sedation medicines used during the test).    FOLLOW UP: Our staff will call the number listed on your records the next business day following your procedure to check on you and address any questions or concerns that you may have regarding the information given to you following your procedure. If we do not reach you, we will leave a message.  However, if you are feeling well and you are not experiencing any problems, there is no need to return our call.  We will assume that you have returned to your regular daily activities without incident.  If any biopsies were taken you will be contacted by phone or by letter within the next 1-3 weeks.  Please call us at [phone] if you have not heard about the biopsies in 3 weeks.    SIGNATURES/CONFIDENTIALITY: You and/or your care partner have signed paperwork which will be entered into your electronic medical record.  These signatures attest to  the fact that that the information above on your After Visit Summary has been reviewed and is understood.  Full responsibility of the confidentiality of this discharge information lies with you and/or your care-partner.

## 2015-03-19 NOTE — Progress Notes (Signed)
Called to room to assist during endoscopic procedure.  Patient ID and intended procedure confirmed with present staff. Received instructions for my participation in the procedure from the performing physician.  

## 2015-03-19 NOTE — Progress Notes (Signed)
Report to PACU, RN, vss, BBS= Clear.  

## 2015-03-19 NOTE — Op Note (Signed)
[place]  [address] [place], [id number]   COLONOSCOPY PROCEDURE REPORT  PATIENT: Dawn, Hopkins  MR#: [id number] BIRTHDATE: 1950-07-12 , 64  yrs. old GENDER: female ENDOSCOPIST: Gatha Mayer, MD, [facility] PROCEDURE DATE:  03/19/2015 PROCEDURE:   Colonoscopy, surveillance , Colonoscopy with biopsy, and Colonoscopy with snare polypectomy Prior Negative Screening - Now for repeat screening.  N/A History of Adenoma - Now for follow-up colonoscopy & has been > or = to 3 yrs.  Yes hx of adenoma.  Has been 3 or more years since last colonoscopy.  Polyps removed today? Yes ASA CLASS:   Class II INDICATIONS:Surveillance due to prior colonic neoplasia and PH Colon Adenoma. MEDICATIONS: Propofol 300 mg IV and Monitored anesthesia care  DESCRIPTION OF PROCEDURE:   After the risks benefits and alternatives of the procedure were thoroughly explained, informed consent was obtained.  The digital rectal exam revealed no abnormalities of the rectum.   The LB [id number]  endoscope was introduced through the anus and advanced to the cecum, which was identified by both the appendix and ileocecal valve. No adverse events experienced.   The quality of the prep was good.  (MiraLax was used)  The instrument was then slowly withdrawn as the colon was fully examined. Estimated blood loss is zero unless otherwise noted in this procedure report.      COLON FINDINGS: Multiple (19) sessile polyps ranging from 1 to 65mm in size were found in the ascending colon, transverse colon, and descending colon.  Polypectomies were performed with cold forceps and with a cold snare (either not both on any one polyp).  The resection was complete, the polyp tissue was completely retrieved and sent to histology.   There was moderate diverticulosis noted in the left colon.   The examination was otherwise normal.   Right colon retroflexion included.  Retroflexed views revealed  no abnormalities. The time to cecum = 3.1 Withdrawal time = 21.1   The scope was withdrawn and the procedure completed. COMPLICATIONS: There were no immediate complications.    ENDOSCOPIC IMPRESSION: 1.   Multiple sessile polyps ranging from 1 to 81mm in size were found in the ascending colon, transverse colon, and descending colon; polypectomies were performed with cold forceps and with a cold snare 2.   Moderate diverticulosis was noted in the left colon 3.   The examination was otherwise normal  RECOMMENDATIONS: Timing of repeat colonoscopy will be determined by pathology findings.  She has hx multiple polyps over the years - ? attenuated polyposis syndrome?  eSigned:  Gatha Mayer, MD, [facility] 03/19/2015 11:26 AM   cc: The Patient         Dr. Addison Naegeli   PATIENT NAME:  Dawn, Hopkins MR#: [id number]

## 2015-03-20 ENCOUNTER — Telehealth: Payer: Self-pay | Admitting: Emergency Medicine

## 2015-03-20 NOTE — Telephone Encounter (Signed)
Left message, no identifier 

## 2015-03-23 ENCOUNTER — Emergency Department (INDEPENDENT_AMBULATORY_CARE_PROVIDER_SITE_OTHER): Payer: BLUE CROSS/BLUE SHIELD

## 2015-03-23 ENCOUNTER — Encounter (HOSPITAL_COMMUNITY): Payer: Self-pay | Admitting: Emergency Medicine

## 2015-03-23 ENCOUNTER — Emergency Department (HOSPITAL_COMMUNITY)
Admission: EM | Admit: 2015-03-23 | Discharge: 2015-03-23 | Disposition: A | Discharge status: Home / Self Care | Payer: BLUE CROSS/BLUE SHIELD | Source: Home / Self Care

## 2015-03-23 DIAGNOSIS — S5292XA Unspecified fracture of left forearm, initial encounter for closed fracture: Secondary | ICD-10-CM

## 2015-03-23 MED ORDER — OXYCODONE-ACETAMINOPHEN 5-325 MG PO TABS
1.0000 | ORAL_TABLET | ORAL | Status: DC | PRN
Start: 2015-03-23 — End: 2017-04-05

## 2015-03-23 MED ORDER — IBUPROFEN 800 MG PO TABS
ORAL_TABLET | ORAL | Status: AC
  Filled 2015-03-23: qty 1

## 2015-03-23 MED ORDER — IBUPROFEN 800 MG PO TABS
800.0000 mg | ORAL_TABLET | Freq: Once | ORAL | Status: AC
  Administered 2015-03-23: 800 mg via ORAL

## 2015-03-23 NOTE — ED Notes (Signed)
C/o left wrist pain due to falling and catching herself with her wrist  Ice used as tx

## 2015-03-23 NOTE — Discharge Instructions (Signed)
Cast or Splint Care Casts and splints support injured limbs and keep bones from moving while they heal. It is important to care for your cast or splint at home.  HOME CARE INSTRUCTIONS  Keep the cast or splint uncovered during the drying period. It can take 24 to 48 hours to dry if it is made of plaster. A fiberglass cast will dry in less than 1 hour.  Do not rest the cast on anything harder than a pillow for the first 24 hours.  Do not put weight on your injured limb or apply pressure to the cast until your health care provider gives you permission.  Keep the cast or splint dry. Wet casts or splints can lose their shape and may not support the limb as well. A wet cast that has lost its shape can also create harmful pressure on your skin when it dries. Also, wet skin can become infected.  Cover the cast or splint with a plastic bag when bathing or when out in the rain or snow. If the cast is on the trunk of the body, take sponge baths until the cast is removed.  If your cast does become wet, dry it with a towel or a blow dryer on the cool setting only.  Keep your cast or splint clean. Soiled casts may be wiped with a moistened cloth.  Do not place any hard or soft foreign objects under your cast or splint, such as cotton, toilet paper, lotion, or powder.  Do not try to scratch the skin under the cast with any object. The object could get stuck inside the cast. Also, scratching could lead to an infection. If itching is a problem, use a blow dryer on a cool setting to relieve discomfort.  Do not trim or cut your cast or remove padding from inside of it.  Exercise all joints next to the injury that are not immobilized by the cast or splint. For example, if you have a long leg cast, exercise the hip joint and toes. If you have an arm cast or splint, exercise the shoulder, elbow, thumb, and fingers.  Elevate your injured arm or leg on 1 or 2 pillows for the first 1 to 3 days to decrease  swelling and pain.It is best if you can comfortably elevate your cast so it is higher than your heart. SEEK MEDICAL CARE IF:   Your cast or splint cracks.  Your cast or splint is too tight or too loose.  You have unbearable itching inside the cast.  Your cast becomes wet or develops a soft spot or area.  You have a bad smell coming from inside your cast.  You get an object stuck under your cast.  Your skin around the cast becomes red or raw.  You have new pain or worsening pain after the cast has been applied. SEEK IMMEDIATE MEDICAL CARE IF:   You have fluid leaking through the cast.  You are unable to move your fingers or toes.  You have discolored (blue or white), cool, painful, or very swollen fingers or toes beyond the cast.  You have tingling or numbness around the injured area.  You have severe pain or pressure under the cast.  You have any difficulty with your breathing or have shortness of breath.  You have chest pain.   This information is not intended to replace advice given to you by your health care provider. Make sure you discuss any questions you have with your health care  provider. Rest,ice,eelvate, weaer splint until seen by Ortho, call tomorrow for f/u appt. Take Percocet as directed. Return if develop new or worsening issues (discoloration, increased pain, etc)  Document Released: 05/27/2000 Document Revised: 03/20/2013 Document Reviewed: 12/06/2012 Elsevier Interactive Patient Education 2016 [employer].

## 2015-03-23 NOTE — Progress Notes (Signed)
Orthopedic Tech Progress Note Patient Details:  Dawn Hopkins 1950-07-15 [id number]  Ortho Devices Type of Ortho Device: Ace wrap, Arm sling, [place] splint Ortho Device/Splint Location: LUE Ortho Device/Splint Interventions: Ordered, Application   Braulio Bosch 03/23/2015, 9:27 PM

## 2015-03-23 NOTE — ED Provider Notes (Signed)
CSN: [id number]     Arrival date & time 03/23/15  1853 History   None    Chief Complaint  Patient presents with  . Wrist Pain   (Consider location/radiation/quality/duration/timing/severity/associated sxs/prior Treatment) HPI Comments: Pt was playing tennis, fell and landed on left wrist [place] injury. Pt is right hand dominant  Patient is a 64 y.o. female presenting with wrist pain. The history is provided by the patient. No language interpreter was used.  Wrist Pain This is a new problem. The current episode started less than 1 hour ago. The problem occurs constantly. The problem has not changed since onset.Pertinent negatives include no chest pain, no abdominal pain, no headaches and no shortness of breath. Nothing aggravates the symptoms. Nothing relieves the symptoms. She has tried nothing for the symptoms. The treatment provided no relief.    Past Medical History  Diagnosis Date  . Adenomatous polyps 11/2002, 8, 2005  . 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 Hyperthyroidism    Past Surgical History  Procedure Laterality Date  . Vaginal hysterectomy  1995  . Colonoscopy w/ polypectomy  6/04, 8/05, 3/11    1cm TV adenoma cecum; 7 mm rectal adenoma; large hyperplastic polyp not removed from transverse  . Tonsillectomy and adenoidectomy  1958  . Wisdom tooth extraction    . Colonoscopy  02/08/2012    Procedure: COLONOSCOPY;  Surgeon: Gatha Mayer, MD;  Location: [facility];  Service: Endoscopy;  Laterality: N/A;  need APC    Family History  Problem Relation Age of Onset  . Breast cancer Mother   . Alzheimer's disease Father   . Colon cancer Neg Hx   . Stomach cancer Neg Hx    Social History  Substance Use Topics  . Smoking status: Current Every Day Smoker -- 1.00 packs/day    Types: Cigarettes  . Smokeless tobacco: Never Used     Comment: cut back to 1/2 pack per day  . Alcohol Use: 0.0 oz/week    0 Standard drinks or equivalent per  week     Comment: rare   OB History    No data available     Review of Systems  Constitutional: Negative for fever.  HENT: Negative.   Eyes: Negative.   Respiratory: Negative for shortness of breath.   Cardiovascular: Negative for chest pain.  Gastrointestinal: Negative for abdominal pain.  Endocrine: Negative.   Genitourinary: Negative.   Skin: Positive for wound.  Neurological: Negative for headaches.  Hematological: Negative.   Psychiatric/Behavioral: Negative.   All other systems reviewed and are negative.   Allergies  Review of patient's allergies indicates no known allergies.  Home Medications   Prior to Admission medications   Medication Sig Start Date End Date Taking? Authorizing Provider  aspirin 81 MG tablet Take 81 mg by mouth daily.    Historical Provider, MD  Calcium Carbonate (CALCIUM 600 PO) Take by mouth daily.     Historical Provider, MD  Cholecalciferol (VITAMIN D PO) Take by mouth daily.    Historical Provider, MD  Flaxseed, Linseed, (FLAXSEED OIL PO) Take by mouth daily.    Historical Provider, MD  Glucosamine 500 MG CAPS Take by mouth daily.      Historical Provider, MD  Magnesium 500 MG CAPS Take by mouth daily.      Historical Provider, MD  Multiple Vitamin (MULTIVITAMIN PO) Take by mouth daily.      Historical Provider, MD  oxyCODONE-acetaminophen (PERCOCET/ROXICET) 5-325 MG tablet Take  1 tablet by mouth every 4 (four) hours as needed for severe pain. 29/47/65   Tori Milks, NP   Meds Ordered and Administered this Visit   Medications  ibuprofen (ADVIL,MOTRIN) tablet 800 mg (800 mg Oral Given 03/23/15 1949)    BP 182/97 mmHg  Pulse 75  Temp(Src) 98.1 F (36.7 C) (Oral)  Resp 16  SpO2 100% No data found.   Physical Exam  Constitutional: She is oriented to person, place, and time. She appears well-developed and well-nourished. She is active.  Non-toxic appearance. She does not have a sickly appearance. She does not appear ill. She  appears distressed.  HENT:  Head: Normocephalic.  Right Ear: Tympanic membrane normal.  Left Ear: Tympanic membrane normal.  Nose: Nose normal.  Mouth/Throat: Uvula is midline and mucous membranes are normal.  Eyes: Pupils are equal, round, and reactive to light.  Neck: Trachea normal and normal range of motion. No muscular tenderness present. No Brudzinski's sign and no Kernig's sign noted.  Cardiovascular: Normal rate and regular rhythm.   Pulmonary/Chest: Effort normal and breath sounds normal.  Musculoskeletal:       Right shoulder: She exhibits decreased range of motion, tenderness and pain. She exhibits no bony tenderness, no swelling, no effusion, no crepitus, no deformity, no laceration, no spasm, normal pulse and normal strength.       Left wrist: She exhibits decreased range of motion, tenderness, bony tenderness, swelling and deformity. She exhibits no effusion, no crepitus and no laceration.  Left wrist +TTP, noted mild deformity/swelling, non dominant hand. NV intact  Neurological: She is alert and oriented to person, place, and time. No cranial nerve deficit or sensory deficit. GCS eye subscore is 4. GCS verbal subscore is 5. GCS motor subscore is 6.  Good distal sensation and movement of left hand/fingers. NV intact, radial +2  Skin: Skin is warm and dry. Abrasion noted. No rash noted.     Psychiatric: She has a normal mood and affect. Her speech is normal and behavior is normal.  Nursing note and vitals reviewed.   ED Course  Procedures (including critical care time)  Labs Review Labs Reviewed - No data to display  Imaging Review Dg Wrist Complete Left  03/23/2015   CLINICAL DATA:  Acute left wrist pain following fall today. Initial encounter.  EXAM: LEFT WRIST - COMPLETE 3+ VIEW  COMPARISON:  None.  FINDINGS: A comminuted intra-articular distal radial fracture is identified without subluxation or dislocation.  No other fractures are identified.  Moderate -severe  degenerative changes at the first carpometacarpal joint noted.  IMPRESSION: Comminuted intra-articular distal radial fracture.   Electronically Signed   By: Margarette Canada M.D.   On: 03/23/2015 19:33       MDM   1. Radial fracture, left, closed, initial encounter     1945: paged Orthopedics on call. Pt given 800mg  Ibuprofen po in office.   2005: Paged Orthopedist, 2nd call.  2010: Spoke with Ortho, discussed plan of care for pt : Ortho aware pt has Comminuted intra-articular distal radial fracture, left wrist. Offered OCL splint and follow up with office, per Ortho, unable to get PACS system up, will call this provider back when reviews films.   2045: Awaiting Brayton Caves, PA, Ortho on call, PA reports still unable to pull up films or advise on treatment for pt. Will call back after goes to [facility] to review films.   2012: Spoke with B. Petraraca, PA, place in [place] OCL, have pt call office  tomorrow will need surgery later this week. Discussed with pt, pt states she will ice,eelvate, wear splint, take meds as directed (Percocet) and follow up with Ortho regarding surgical eval/ortho follow up this week. Initial fracture care provided with Ortho f/u arranged. Pt verbalized understanding to this provider. Ortho tech, Nicole Kindred, here to splint left wrist. Pt tolerated well. NV intact pre/post splint application.   Tori Milks, NP 56/38/75 [id number]

## 2015-04-01 ENCOUNTER — Encounter: Payer: Self-pay | Admitting: Internal Medicine

## 2015-04-01 NOTE — Progress Notes (Signed)
Quick Note:  7 adenomas/ssp/a's and 12 hyperplastic all diminutive Repeat colon 2018 ______

## 2015-07-08 DIAGNOSIS — R7301 Impaired fasting glucose: Secondary | ICD-10-CM | POA: Insufficient documentation

## 2015-07-08 DIAGNOSIS — F17200 Nicotine dependence, unspecified, uncomplicated: Secondary | ICD-10-CM | POA: Insufficient documentation

## 2015-07-08 DIAGNOSIS — E0590 Thyrotoxicosis, unspecified without thyrotoxic crisis or storm: Secondary | ICD-10-CM | POA: Insufficient documentation

## 2015-07-08 DIAGNOSIS — E0520 Thyrotoxicosis with toxic multinodular goiter without thyrotoxic crisis or storm: Secondary | ICD-10-CM | POA: Insufficient documentation

## 2015-07-08 DIAGNOSIS — D729 Disorder of white blood cells, unspecified: Secondary | ICD-10-CM | POA: Insufficient documentation

## 2015-07-08 DIAGNOSIS — B009 Herpesviral infection, unspecified: Secondary | ICD-10-CM | POA: Insufficient documentation

## 2015-07-08 DIAGNOSIS — E785 Hyperlipidemia, unspecified: Secondary | ICD-10-CM | POA: Insufficient documentation

## 2015-07-08 DIAGNOSIS — E559 Vitamin D deficiency, unspecified: Secondary | ICD-10-CM | POA: Insufficient documentation

## 2015-09-29 ENCOUNTER — Other Ambulatory Visit: Payer: Self-pay | Admitting: Physician Assistant

## 2016-08-05 DIAGNOSIS — Z0000 Encounter for general adult medical examination without abnormal findings: Secondary | ICD-10-CM | POA: Insufficient documentation

## 2017-03-13 ENCOUNTER — Encounter: Payer: Self-pay | Admitting: Internal Medicine

## 2017-04-05 ENCOUNTER — Encounter: Payer: Self-pay | Admitting: Internal Medicine

## 2017-04-05 ENCOUNTER — Ambulatory Visit (AMBULATORY_SURGERY_CENTER): Payer: Self-pay | Admitting: *Deleted

## 2017-04-05 VITALS — Ht 65.0 in | Wt 165.0 lb

## 2017-04-05 DIAGNOSIS — Z86010 Personal history of colonic polyps: Secondary | ICD-10-CM

## 2017-04-05 NOTE — Progress Notes (Signed)
Patient denies any allergies to eggs or soy. Patient denies any problems with anesthesia/sedation. Patient denies any oxygen use at home. Patient denies taking any diet/weight loss medications or blood thinners. EMMI education assisgned to patient on colonoscopy, this was explained and instructions given to patient. 

## 2017-04-19 ENCOUNTER — Ambulatory Visit (AMBULATORY_SURGERY_CENTER): Payer: Medicare HMO | Admitting: Internal Medicine

## 2017-04-19 ENCOUNTER — Encounter: Payer: Self-pay | Admitting: Internal Medicine

## 2017-04-19 VITALS — BP 156/73 | HR 59 | Temp 98.0°F | Resp 16 | Ht 65.0 in | Wt 165.0 lb

## 2017-04-19 DIAGNOSIS — D125 Benign neoplasm of sigmoid colon: Secondary | ICD-10-CM | POA: Diagnosis not present

## 2017-04-19 DIAGNOSIS — D122 Benign neoplasm of ascending colon: Secondary | ICD-10-CM

## 2017-04-19 DIAGNOSIS — Z86010 Personal history of colonic polyps: Secondary | ICD-10-CM | POA: Diagnosis present

## 2017-04-19 DIAGNOSIS — D123 Benign neoplasm of transverse colon: Secondary | ICD-10-CM

## 2017-04-19 DIAGNOSIS — D124 Benign neoplasm of descending colon: Secondary | ICD-10-CM

## 2017-04-19 MED ORDER — SODIUM CHLORIDE 0.9 % IV SOLN: 500.0000 mL | INTRAVENOUS | Status: DC

## 2017-04-19 NOTE — Progress Notes (Signed)
Called to room to assist during endoscopic procedure.  Patient ID and intended procedure confirmed with present staff. Received instructions for my participation in the procedure from the performing physician.  

## 2017-04-19 NOTE — Op Note (Signed)
[place] Patient Name: Dawn Hopkins Procedure Date: 04/19/2017 3:19 PM MRN: [id number] Endoscopist: Gatha Mayer , MD Age: 66 Referring MD:  Date of Birth: 1950-06-23 Gender: Female Account #: [id number] Procedure:                Colonoscopy Indications:              Surveillance: History of numerous (> 10) adenomas                            on last colonoscopy (< 3 yrs) Medicines:                Monitored Anesthesia Care Procedure:                Pre-Anesthesia Assessment:                           - Prior to the procedure, a History and Physical                            was performed, and patient medications and                            allergies were reviewed. The patient's tolerance of                            previous anesthesia was also reviewed. The risks                            and benefits of the procedure and the sedation                            options and risks were discussed with the patient.                            All questions were answered, and informed consent                            was obtained. Prior Anticoagulants: The patient has                            taken no previous anticoagulant or antiplatelet                            agents. ASA Grade Assessment: II - A patient with                            mild systemic disease. After reviewing the risks                            and benefits, the patient was deemed in                            satisfactory condition to undergo the procedure.  After obtaining informed consent, the colonoscope                            was passed under direct vision. Throughout the                            procedure, the patient's blood pressure, pulse, and                            oxygen saturations were monitored continuously. The                            Colonoscope was introduced through the anus and                            advanced to the the cecum,  identified by                            appendiceal orifice and ileocecal valve. The                            colonoscopy was performed without difficulty. The                            patient tolerated the procedure well. The quality                            of the bowel preparation was good. The ileocecal                            valve, appendiceal orifice, and rectum were                            photographed. The bowel preparation used was                            Miralax. Scope In: 3:30:17 PM Scope Out: 3:47:17 PM Scope Withdrawal Time: 0 hours 13 minutes 52 seconds  Total Procedure Duration: 0 hours 17 minutes 0 seconds  Findings:                 The perianal and digital rectal examinations were                            normal.                           Three flat and sessile polyps were found in the                            sigmoid colon, descending colon and ascending                            colon. The polyps were 4 to 7 mm in size. These  polyps were removed with a cold snare. Resection                            was complete, but the polyp tissue was only                            partially retrieved. Verification of patient                            identification for the specimen was done. Estimated                            blood loss was minimal.                           Four sessile polyps were found in the transverse                            colon. The polyps were 1 to 3 mm in size. These                            polyps were removed with a cold biopsy forceps.                            Resection and retrieval were complete. Verification                            of patient identification for the specimen was                            done. Estimated blood loss was minimal.                           Multiple diverticula were found in the sigmoid                            colon.                           The exam  was otherwise without abnormality on                            direct and retroflexion views. Complications:            No immediate complications. Estimated Blood Loss:     Estimated blood loss was minimal. Impression:               - Three 4 to 7 mm polyps in the sigmoid colon, in                            the descending colon and in the ascending colon,                            removed with a cold snare. Complete resection.  Partial retrieval.                           - Four 1 to 3 mm polyps in the transverse colon,                            removed with a cold biopsy forceps. Resected and                            retrieved.                           - Diverticulosis in the sigmoid colon.                           - The examination was otherwise normal on direct                            and retroflexion views. Recommendation:           - Patient has a contact number available for                            emergencies. The signs and symptoms of potential                            delayed complications were discussed with the                            patient. Return to normal activities tomorrow.                            Written discharge instructions were provided to the                            patient.                           - Resume previous diet.                           - Continue present medications.                           - Repeat colonoscopy is recommended for                            surveillance. The colonoscopy date will be                            determined after pathology results from today's                            exam become available for review. Gatha Mayer, MD 04/19/2017 4:03:59 PM This report has been signed electronically.

## 2017-04-19 NOTE — Progress Notes (Signed)
A and O x3. Report to RN. Tolerated MAC anesthesia well.

## 2017-04-19 NOTE — Patient Instructions (Addendum)
   I found and removed 7 polyps - recovered all but 1 tiny one.  All look benign.  I will let you know pathology results and when to have another routine colonoscopy by mail and/or My Chart. Gatha Mayer, MD, [facility]  Handouts given : Polyps and Diverticulosis.  YOU HAD AN ENDOSCOPIC PROCEDURE TODAY AT [facility]:   Refer to the procedure report that was given to you for any specific questions about what was found during the examination.  If the procedure report does not answer your questions, please call your gastroenterologist to clarify.  If you requested that your care partner not be given the details of your procedure findings, then the procedure report has been included in a sealed envelope for you to review at your convenience later.  YOU SHOULD EXPECT: Some feelings of bloating in the abdomen. Passage of more gas than usual.  Walking can help get rid of the air that was put into your GI tract during the procedure and reduce the bloating. If you had a lower endoscopy (such as a colonoscopy or flexible sigmoidoscopy) you may notice spotting of blood in your stool or on the toilet paper. If you underwent a bowel prep for your procedure, you may not have a normal bowel movement for a few days.  Please Note:  You might notice some irritation and congestion in your nose or some drainage.  This is from the oxygen used during your procedure.  There is no need for concern and it should clear up in a day or so.  SYMPTOMS TO REPORT IMMEDIATELY:   Following lower endoscopy (colonoscopy or flexible sigmoidoscopy):  Excessive amounts of blood in the stool  Significant tenderness or worsening of abdominal pains  Swelling of the abdomen that is new, acute  Fever of 100F or higher  For urgent or emergent issues, a gastroenterologist can be reached at any hour by calling [phone].   DIET:  We do recommend a small meal at first, but then you may proceed to your regular  diet.  Drink plenty of fluids but you should avoid alcoholic beverages for 24 hours.  ACTIVITY:  You should plan to take it easy for the rest of today and you should NOT DRIVE or use heavy machinery until tomorrow (because of the sedation medicines used during the test).    FOLLOW UP: Our staff will call the number listed on your records the next business day following your procedure to check on you and address any questions or concerns that you may have regarding the information given to you following your procedure. If we do not reach you, we will leave a message.  However, if you are feeling well and you are not experiencing any problems, there is no need to return our call.  We will assume that you have returned to your regular daily activities without incident.  If any biopsies were taken you will be contacted by phone or by letter within the next 1-3 weeks.  Please call us at [phone] if you have not heard about the biopsies in 3 weeks.    SIGNATURES/CONFIDENTIALITY: You and/or your care partner have signed paperwork which will be entered into your electronic medical record.  These signatures attest to the fact that that the information above on your After Visit Summary has been reviewed and is understood.  Full responsibility of the confidentiality of this discharge information lies with you and/or your care-partner.

## 2017-04-20 ENCOUNTER — Telehealth: Payer: Self-pay | Admitting: *Deleted

## 2017-04-20 NOTE — Telephone Encounter (Signed)
  Follow up Call-  Call back number 04/19/2017 03/19/2015  Post procedure Call Back phone  # [phone]  Permission to leave phone message Yes Yes  Some recent data might be hidden     Patient questions:  Do you have a fever, pain , or abdominal swelling? No. Pain Score  0 *  Have you tolerated food without any problems? Yes.    Have you been able to return to your normal activities? Yes.    Do you have any questions about your discharge instructions: Diet   No. Medications  No. Follow up visit  No.  Do you have questions or concerns about your Care? No.  Actions: * If pain score is 4 or above: No action needed, pain <4.

## 2017-04-26 ENCOUNTER — Encounter: Payer: Self-pay | Admitting: Internal Medicine

## 2017-04-27 ENCOUNTER — Other Ambulatory Visit: Payer: Self-pay

## 2017-04-27 DIAGNOSIS — K635 Polyp of colon: Secondary | ICD-10-CM

## 2017-04-28 ENCOUNTER — Telehealth: Payer: Self-pay | Admitting: Internal Medicine

## 2017-04-28 NOTE — Telephone Encounter (Signed)
The pt was advised that the genetics counselor will test for genetics disposition to polyposis, etc.  This will be beneficial to her family.

## 2017-06-12 ENCOUNTER — Encounter: Payer: Self-pay | Admitting: Genetic Counselor

## 2017-06-12 ENCOUNTER — Ambulatory Visit (HOSPITAL_BASED_OUTPATIENT_CLINIC_OR_DEPARTMENT_OTHER): Payer: Medicare HMO | Admitting: Genetic Counselor

## 2017-06-12 ENCOUNTER — Other Ambulatory Visit: Payer: Medicare HMO

## 2017-06-12 DIAGNOSIS — Z7183 Encounter for nonprocreative genetic counseling: Secondary | ICD-10-CM | POA: Diagnosis not present

## 2017-06-12 DIAGNOSIS — Z808 Family history of malignant neoplasm of other organs or systems: Secondary | ICD-10-CM

## 2017-06-12 DIAGNOSIS — Z86010 Personal history of colonic polyps: Secondary | ICD-10-CM

## 2017-06-12 DIAGNOSIS — Z800 Family history of malignant neoplasm of digestive organs: Secondary | ICD-10-CM | POA: Insufficient documentation

## 2017-06-12 DIAGNOSIS — Z803 Family history of malignant neoplasm of breast: Secondary | ICD-10-CM

## 2017-06-12 NOTE — Progress Notes (Addendum)
REFERRING PROVIDER: Gatha Mayer, MD [address], [place] [address]  PRIMARY PROVIDER:  Garlan Fillers, MD  PRIMARY REASON FOR VISIT:  1. Personal history of colonic polyps - 18 total ? polyposis   2. Family history of brain cancer   3. Family history of breast cancer   4. Family history of colon cancer      HISTORY OF PRESENT ILLNESS:   Dawn Hopkins, a 66 y.o. female, was seen for a [place] cancer genetics consultation at the request of Dr. Carlean Purl due to a personal and family history of cancer.  Dawn Hopkins presents to clinic today to discuss the possibility of a hereditary predisposition to cancer, genetic testing, and to further clarify her future cancer risks, as well as potential cancer risks for family members.  Dawn Hopkins is a 66 y.o. female with no personal history of cancer.  Since her original colonoscopy in 2004, she has had a total of at least 35 polyps: 2004 - 5 polyps 2005 - 3 polyps 2011 - 1 polyp 2016 - 19 polyps 2018 - 7 polyps, 3 flat sessile polyps in the sigmoid colon and 4 sessile polyps in the transverse colon.  CANCER HISTORY:   No history exists.     HORMONAL RISK FACTORS:  Menarche was at age 39-13.  First live birth at age 40.  OCP use for approximately 5-10 years.  Ovaries intact: yes.  Hysterectomy: yes.  Menopausal status: postmenopausal.  HRT use: 0 years. Colonoscopy: yes; abnormal. Mammogram within the last year: yes. Number of breast biopsies: 0. Up to date with pelvic exams:  yes. Any excessive radiation exposure in the past:  no  Past Medical History:  Diagnosis Date  . Adenomatous polyps 11/2002, 8, 2005  . Family history of brain cancer   . Family history of breast cancer   . Family history of colon cancer   . Hyperthyroidism   . Personal history of colonic polyps - 18 total ? polyposis 11/26/2010   11/2002 - 1 cm tubulovillous adenoma of  cecum w/HGD 01/2004 7 mm adenoma of rectum  2011 large flat hyperplastic polyp  01/2012 - 5 diminutive adenomas 03/19/2015 19 diminutive polyps 12 descending hyperplastic and 7 were adenomas/ssp from proximal colon -  04/19/2017 4 adenomas recall 2020    . 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 COLONOSCOPY  02/08/2012   Procedure: COLONOSCOPY;  Surgeon: Gatha Mayer, MD;  Location: [facility];  Service: Endoscopy;  Laterality: N/A;  need APC   . COLONOSCOPY W/ POLYPECTOMY  6/04, 8/05, 3/11   1cm TV adenoma cecum; 7 mm rectal adenoma; large hyperplastic polyp not removed from transverse  . TONSILLECTOMY AND ADENOIDECTOMY  1958  . VAGINAL HYSTERECTOMY  1995  . WISDOM TOOTH EXTRACTION      Social History   Socioeconomic History  . Marital status: Divorced    Spouse name: Not on file  . Number of children: 1  . Years of education: Not on file  . Highest education level: Not on file  Social Needs  . Financial resource strain: Not on file  . Food insecurity - worry: Not on file  . Food insecurity - inability: Not on file  . Transportation needs - medical: Not on file  . Transportation needs - non-medical: Not on file  Occupational History  . Occupation: Administrator: [employer]  Use  . Smoking status: Current Every Day Smoker    Packs/day: 1.00    Types: Cigarettes  . Smokeless tobacco: Never Used  . Tobacco comment: cut back to 1/2 pack per day  Substance and Sexual Activity  . Alcohol use: Yes    Alcohol/week: 0.0 oz    Comment: 5 times a week per pt  . Drug use: No  . Sexual activity: Not on file  Other Topics Concern  . Not on file  Social History Narrative  . Not on file     FAMILY HISTORY:  We obtained a detailed, 4-generation family history.  Significant diagnoses are listed below: Family History  Problem Relation Age of Onset  . Breast cancer Mother        dx in her 3s  . Alzheimer's disease Father   . Cancer Paternal Uncle    . Colon cancer Maternal Grandfather        dx in his 12s  . Brain cancer Daughter 9       oligodendroglioma  . Stomach cancer Neg Hx     The patient has two children, a son who is healthy at 55 and a daughter who died at age 55 from an oligodendroglioma.  She has one brother who is cancer free who has a son and daughter who are healthy.  Both parents are deceased.  The patient's mother had breast cancer in her 29's and had several recurrences before she died at 63.  Her mother had two brothers who were estranged from the family and no information about them is know.  The maternal grandparents are deceased.  The grandmother died at 50 and the grandfather died from colon cancer in his 74's.  He had several siblings that may have had cancer.  The patient's father died from dementia at 47.  He had one brother who may have had cancer.  The paternal grandparents are deceased from non cancer related issues.  Dawn Hopkins is unaware of previous family history of genetic testing for hereditary cancer risks. Patient's maternal ancestors are of [place] descent, and paternal ancestors are of Caucasian descent. There is no reported Ashkenazi Jewish ancestry. There is no known consanguinity.  GENETIC COUNSELING ASSESSMENT: Dawn Hopkins is a 66 y.o. female with a personal history of colon polyps and family history of breast, brain and colon cancer which is somewhat suggestive of a hereditary cancer syndrome and predisposition to cancer. We, therefore, discussed and recommended the following at today's visit.   DISCUSSION: We discussed that polyps are relatively common, however most people who have polyps will have fewer than 5.  When an individual has more than 10 polyps in their lifetime we worry that it could be due to a hereditary polyposis syndrome.  We discussed hereditary colon cancer and that about 5-6% of colon cancer is due to hereditary causes, most commonly Lynch syndrome.  Based on the  patient's personal history of colon polyps and family history of cancer we proceeded with testing.    We reviewed the characteristics, features and inheritance patterns of hereditary cancer syndromes. We also discussed genetic testing, including the appropriate family members to test, the process of testing, insurance coverage and turn-around-time for results. We discussed the implications of a negative, positive and/or variant of uncertain significant result. We recommended Dawn Hopkins pursue genetic testing for the Multi cancer gene panel.   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  According to the [employer]  (NCCN) Genetic/Familial High-Risk Assessment: Colorectal Guidelines (v.3.2017) (AffordableShare.com.br.pdf).  A clinical diagnosis of serrated polyposis is considered in an individual who meets at least one of the following empiric criteria:  1. At least 5 serrated polyps proximal to the sigmoid colon with 2 or more of these being >10 mm 2. Any number of serrated polyps proximal to the sigmoid colon in an individual who has a first-degree relative with serrated polyposis,  3. >/= 20 serrated polyps of any size, but distributed throughout the colon.  Currently, no causative gene has been identified for serrated polyposis.  The risk for colon cancer in this syndrome is elevated, although the precise risk remains to be defined.  Occasionally, more than one affected case of serrated polyposis is seen in a family, and therefore family members should undergo colon screening.  Based on Dawn Hopkins's personal history of colon polyps and family history of cancer, she meets medical criteria for genetic testing.  Despite that she meets criteria, she may still have an out of pocket cost. We discussed that if her out of pocket cost for testing is over $100, the laboratory will call and confirm whether she wants to proceed with testing.  If the out of pocket cost of testing is less than $100 she will be billed by the genetic testing laboratory.   PLAN: After considering the risks, benefits, and limitations, Dawn Hopkins  provided informed consent to pursue genetic testing and the blood sample was sent to [facility] for analysis of the Multi-cancer panel. Results should be available within approximately 2-3 weeks' time, at which point they will be disclosed by telephone to Dawn Hopkins, as will any additional recommendations warranted by these results. Dawn Hopkins will receive a summary of her genetic counseling visit and a copy of her results once available. This information will also be available in Epic. We encouraged Dawn Hopkins to remain in contact with cancer genetics annually so that we can continuously update the family history and inform her of any changes in cancer genetics and testing that may be of benefit for her family. Dawn Hopkins questions were answered to her satisfaction today. Our contact information was provided should additional questions or concerns arise.  Lastly, we encouraged Dawn Hopkins to remain in contact with cancer genetics annually so that we can continuously update the family history and inform her of any changes in cancer genetics and testing that may be of benefit for this family.   Ms.  Hopkins questions were answered to her satisfaction today. Our contact information was provided should additional questions or concerns arise. Thank you for the referral and allowing [place] to share in the care of your patient.   Fay Swider P. Florene Glen, [place], [facility] Certified Genetic Counselor [email or web address] phone: [phone]  The patient was seen for a total of 60 minutes in face-to-face  genetic counseling.  This patient was discussed with Drs. Magrinat, Lindi Adie and/or Burr Medico who agrees with the above.    _______________________________________________________________________ For Office Staff:  Number of people involved in session: 1 Was an Intern/ student involved with case: no

## 2017-06-20 ENCOUNTER — Encounter: Payer: Self-pay | Admitting: Genetic Counselor

## 2017-06-20 ENCOUNTER — Telehealth: Payer: Self-pay | Admitting: Genetic Counselor

## 2017-06-20 ENCOUNTER — Ambulatory Visit: Payer: Self-pay | Admitting: Genetic Counselor

## 2017-06-20 DIAGNOSIS — Z1379 Encounter for other screening for genetic and chromosomal anomalies: Secondary | ICD-10-CM | POA: Insufficient documentation

## 2017-06-20 DIAGNOSIS — Z86010 Personal history of colonic polyps: Secondary | ICD-10-CM

## 2017-06-20 DIAGNOSIS — Z808 Family history of malignant neoplasm of other organs or systems: Secondary | ICD-10-CM

## 2017-06-20 DIAGNOSIS — Z803 Family history of malignant neoplasm of breast: Secondary | ICD-10-CM

## 2017-06-20 DIAGNOSIS — Z800 Family history of malignant neoplasm of digestive organs: Secondary | ICD-10-CM

## 2017-06-20 NOTE — Progress Notes (Signed)
HPI: Dawn Hopkins was previously seen in the [place] clinic due to a personal history of colon polyps and family history of cancer and concerns regarding a hereditary predisposition to cancer. Please refer to our prior cancer genetics clinic note for more information regarding Dawn Hopkins's medical, social and family histories, and our assessment and recommendations, at the time. Dawn Hopkins recent genetic test results were disclosed to her, as were recommendations warranted by these results. These results and recommendations are discussed in more detail below.  CANCER HISTORY:   No history exists.    FAMILY HISTORY:  We obtained a detailed, 4-generation family history.  Significant diagnoses are listed below: Family History  Problem Relation Age of Onset  . Breast cancer Mother        dx in her 14s  . Alzheimer's disease Father   . Cancer Paternal Uncle   . Colon cancer Maternal Grandfather        dx in his 39s  . Brain cancer Daughter 9       oligodendroglioma  . Stomach cancer Neg Hx     The patient has two children, a son who is healthy at 26 and a daughter who died at age 83 from an oligodendroglioma.  She has one brother who is cancer free who has a son and daughter who are healthy.  Both parents are deceased.  The patient's mother had breast cancer in her 39's and had several recurrences before she died at 31.  Her mother had two brothers who were estranged from the family and no information about them is know.  The maternal grandparents are deceased.  The grandmother died at 67 and the grandfather died from colon cancer in his 93's.  He had several siblings that may have had cancer.  The patient's father died from dementia at 76.  He had one brother who may have had cancer.  The paternal grandparents are deceased from non cancer related issues.  Dawn Hopkins is unaware of previous family history of genetic testing for hereditary cancer risks. Patient's maternal  ancestors are of [place] descent, and paternal ancestors are of Caucasian descent. There is no reported Ashkenazi Jewish ancestry. There is no known consanguinity.  GENETIC TEST RESULTS: Genetic testing reported out on June 20, 2017 through the Multicancer panel found no deleterious mutations.  The Multi-Gene Panel offered by [facility] includes sequencing and/or deletion duplication testing of the following 83 genes: ALK, APC, ATM, AXIN2,BAP1,  BARD1, BLM, BMPR1A, BRCA1, BRCA2, BRIP1, CASR, CDC73, CDH1, CDK4, CDKN1B, CDKN1C, CDKN2A (p14ARF), CDKN2A (p16INK4a), CEBPA, CHEK2, CTNNA1, DICER1, DIS3L2, EGFR (c.2369C>T, p.Thr790Met variant only), EPCAM (Deletion/duplication testing only), FH, FLCN, GATA2, GPC3, GREM1 (Promoter region deletion/duplication testing only), HOXB13 (c.251G>A, p.Gly84Glu), HRAS, KIT, MAX, MEN1, MET, MITF (c.952G>A, p.Glu318Lys variant only), MLH1, MSH2, MSH3, MSH6, MUTYH, NBN, NF1, NF2, NTHL1, PALB2, PDGFRA, PHOX2B, PMS2, POLD1, POLE, POT1, PRKAR1A, PTCH1, PTEN, RAD50, RAD51C, RAD51D, RB1, RECQL4, RET, RUNX1, SDHAF2, SDHA (sequence changes only), SDHB, SDHC, SDHD, SMAD4, SMARCA4, SMARCB1, SMARCE1, STK11, SUFU, TERT, TERT, TMEM127, TP53, TSC1, TSC2, VHL, WRN and WT1.   The test report has been scanned into EPIC and is located under the Molecular Pathology section of the Results Review tab.   We discussed with Dawn Hopkins that since the current genetic testing is not perfect, it is possible there may be a gene mutation in one of these genes that current testing cannot detect, but that chance is small. We also discussed, that it is possible that another gene  that has not yet been discovered, or that we have not yet tested, is responsible for the cancer diagnoses in the family, and it is, therefore, important to remain in touch with cancer genetics in the future so that we can continue to offer Dawn Hopkins the most up to date genetic testing.   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 At this time, it is unknown if this variant is associated with increased cancer risk or if this is a normal finding, but most variants such as this get reclassified to being inconsequential. It should not be used to make medical management decisions. With time, we suspect the lab will determine the significance of this variant, if any. If we do learn more about it, we will try to contact Dawn Hopkins to discuss it further. However, it is important to stay in touch with [place] periodically and keep the address and phone number up to date.  CANCER SCREENING RECOMMENDATIONS: This negative genetic test simply tells [place] that we cannot yet define why Dawn Hopkins has had an increased number of colorectal polyps. Dawn Hopkins medical management and screening should be based on the prospect that she will likely form more colon polyps therefore, undergo more frequent colonoscopy screening at intervals determined by her GI providers.  We also recommended that Dawn Hopkins have an upper endoscopy periodically.  Based on Dawn Hopkins's pathology report of hyperplastic and sessile serrated polyps, she meets the criteria for a clinical diagnosis of Serrated Polyposis syndrome (previously known as hyperplastic polyposis).  According to the [employer]  (NCCN) Genetic/Familial High-Risk Assessment: Colorectal Guidelines (v.3.2017) (AffordableShare.com.br.pdf).  A clinical diagnosis of serrated polyposis is considered in an individual who meets at least one of the following empiric criteria:  1. At least 5 serrated polyps proximal to the sigmoid colon with 2 or more of these being >10 mm 2. Any number of serrated polyps proximal to the sigmoid colon in an individual who has a first-degree relative with serrated polyposis,  3. >/= 20 serrated polyps of any size, but distributed throughout the colon.  Currently,  no causative gene has been identified for serrated polyposis.  The risk for colon cancer in this syndrome is elevated, although the precise risk remains to be defined.  Occasionally, more than one affected case of serrated polyposis is seen in a family, and therefore family members should undergo colon screening.  Surveillance recommendations for individuals with serrated polyposis include:  1. Colonoscopy with polypectomy until all polyps >/= 5 mm are removed, then colonoscopy every 1-3 years depending on the number and size of polyps.  Clearing of all polyps is preferable but not always possible. 2. Consider surgical referral if colonoscopic treatment and/or surveillance is inadequate or if high-grade dysplasia occurs.  RECOMMENDATIONS FOR FAMILY MEMBERS: The risk for colon cancer in relatives of individuals with serrated polyposis is still unclear.  Pending further data, it is reasonable to screen first-degree relatives at the youngest age of onset of serrated polyposis diagnosis and subsequently per colonoscopic findings.  First degree relatives are encouraged to have colonoscopy at the earliest of the following: 1. Age 42 2. Same age as youngest diagnosis of serrated polyposis if uncomplicated by cancer,  3. 10 years earlier than the earliest diagnosis in family of Colorectal cancer complicating serrated polyposis.  Following the baseline exam, family members should repeat their colonoscopy every 5 years if no polyps are found.  If proximal serrated polyps  or multiple adenomas are found, consider colonoscopy every 3 years.   FOLLOW-UP: Lastly, we discussed with Dawn Hopkins that cancer genetics is a rapidly advancing field and it is possible that new genetic tests will be appropriate for her and/or her family members in the future. We encouraged her to remain in contact with cancer genetics on an annual basis so we can update her personal and family histories and let her know of advances in  cancer genetics that may benefit this family.   Our contact number was provided. Dawn Hopkins questions were answered to her satisfaction, and she knows she is welcome to call us at anytime with additional questions or concerns.   Roma Kayser, MS, [facility] Certified Genetic Counselor [email or web address]

## 2017-06-20 NOTE — Telephone Encounter (Signed)
LM on VM with good news.  Asked that she CB. 

## 2017-06-20 NOTE — Telephone Encounter (Signed)
Revealed negative genetic testing.  Discussed that we do not know why she has polyposis or why there is cancer in the family. It could be due to a different gene that we are not testing, or maybe our current technology may not be able to pick something up.  It will be important for her to keep in contact with genetics to keep up with whether additional testing may be needed.  We discussed the possibility of Sessile serrated polyposis syndrome.  I will put this in my note back to Dr. Carlean Purl.

## 2017-06-28 ENCOUNTER — Other Ambulatory Visit: Payer: Self-pay | Admitting: Physician Assistant

## 2017-10-03 IMAGING — DX DG WRIST COMPLETE 3+V*L*
4 series · 4 of 4 positions shown · non-contrast
Comparison: None.

CLINICAL DATA: Acute left wrist pain following fall today. Initial
encounter.

EXAM:
LEFT WRIST - COMPLETE 3+ VIEW

[wrist pa]
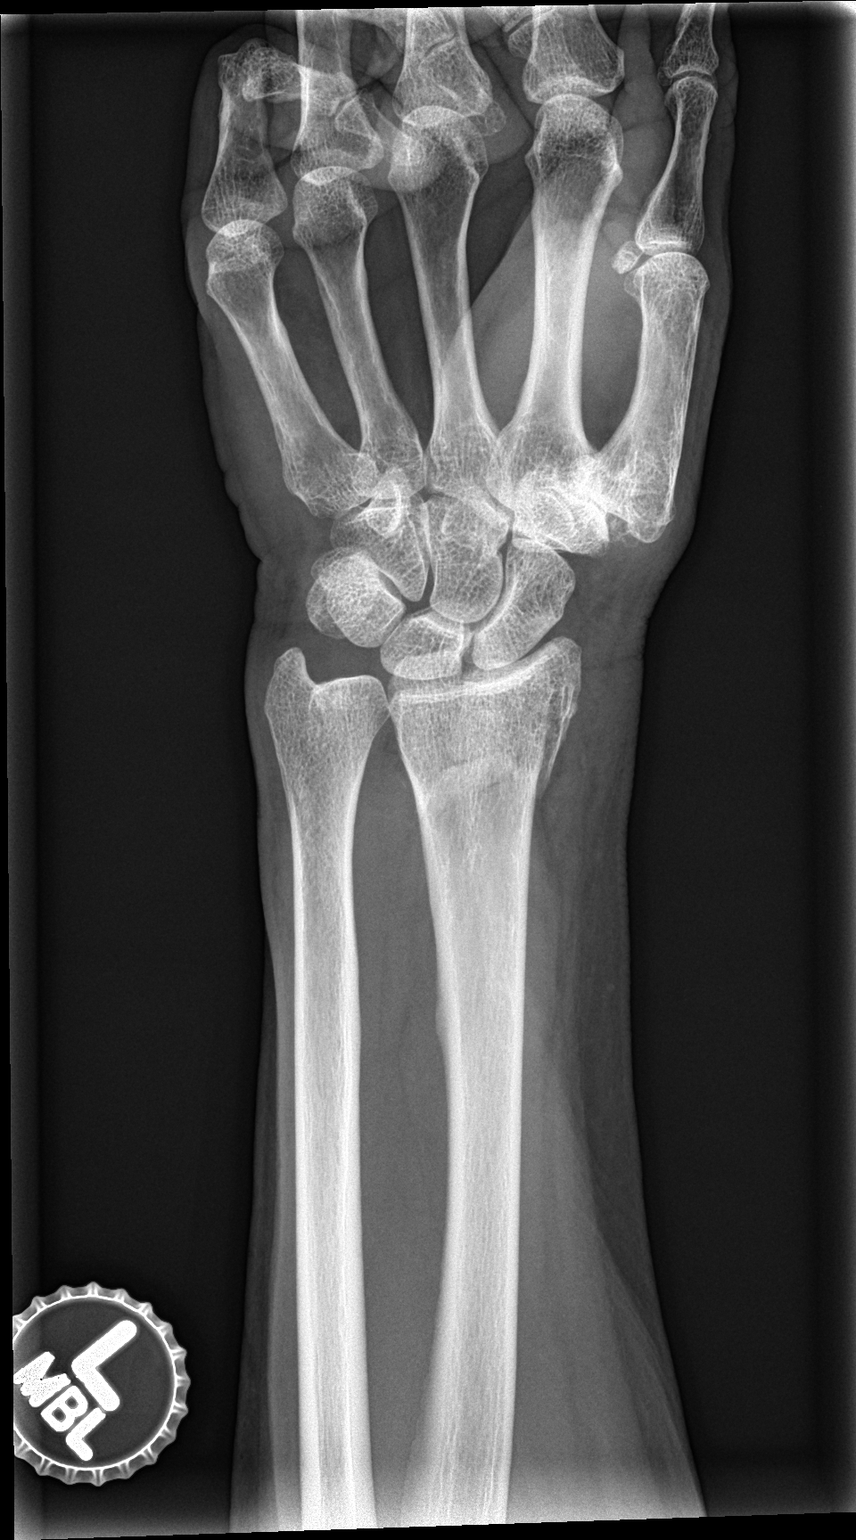

[wrist navicular]
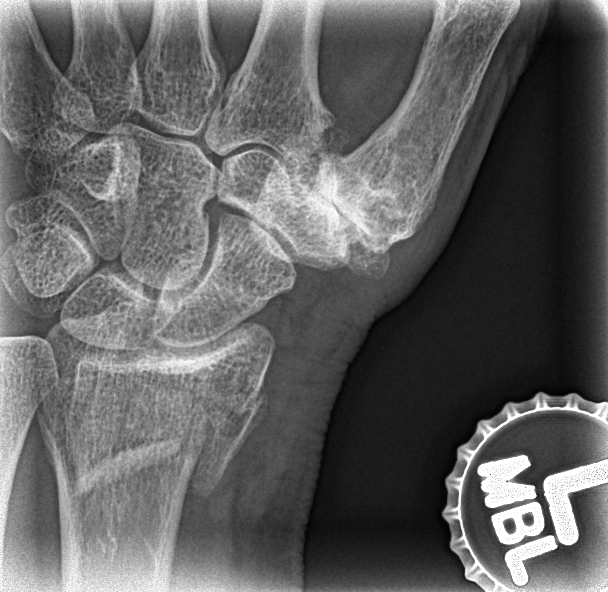

[wrist obl]
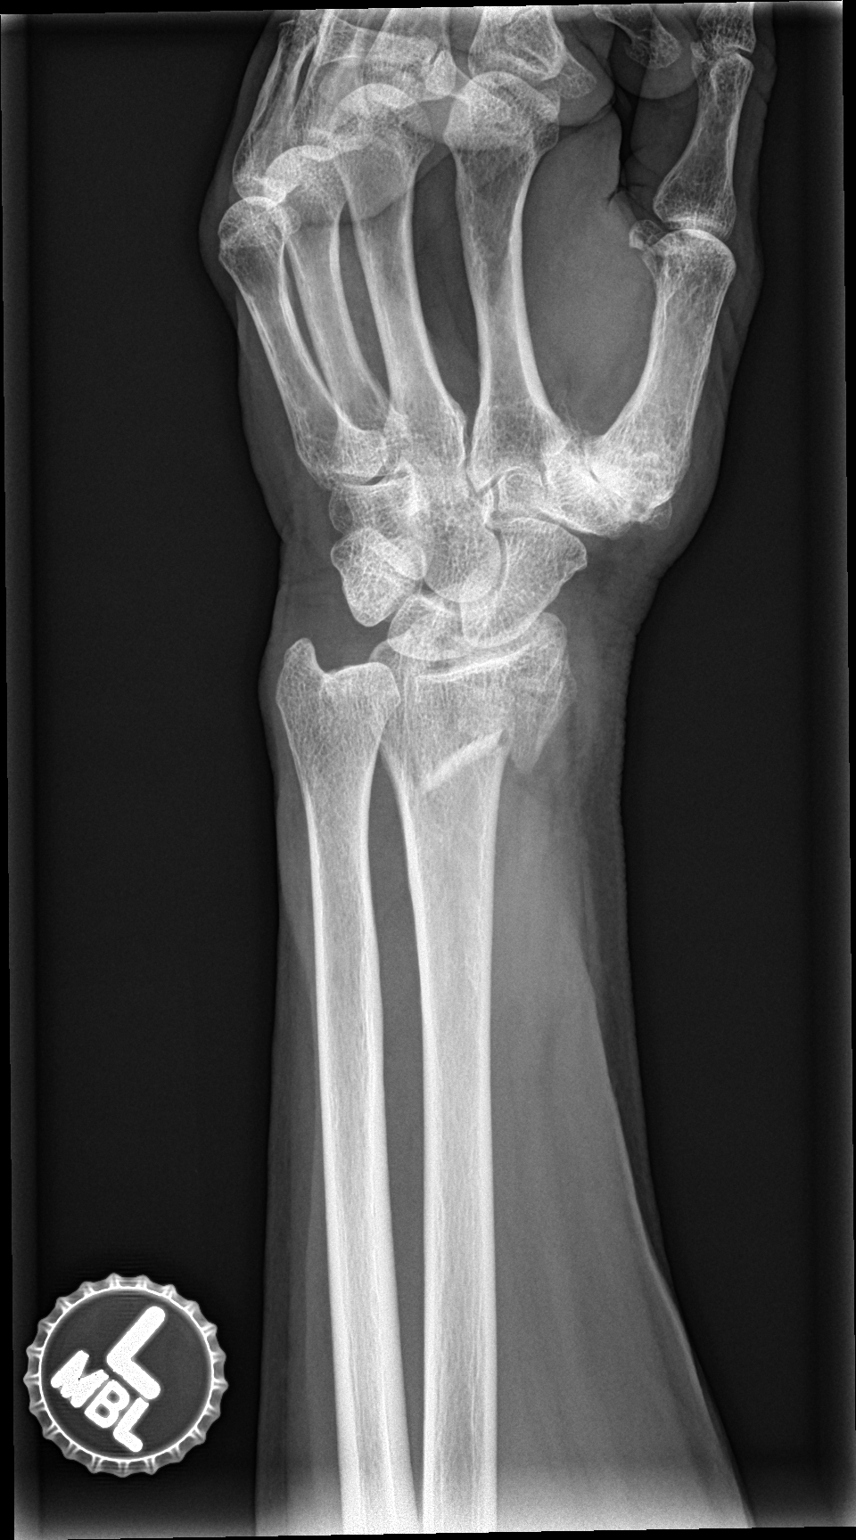

[wrist lat]
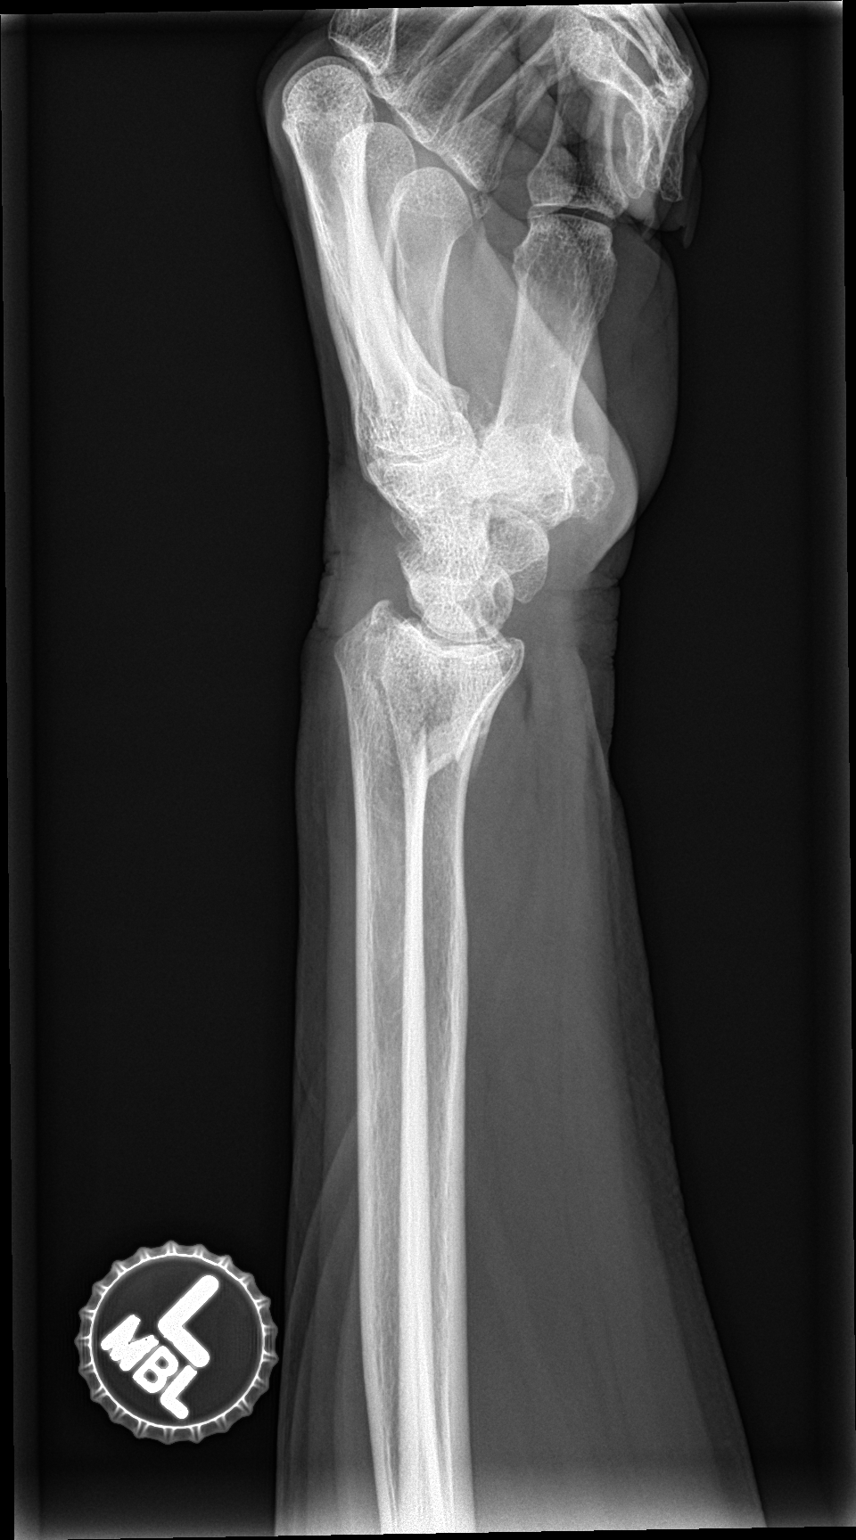

[4 of 4 positions shown; findings below may reference images not displayed]

FINDINGS: A comminuted intra-articular distal radial fracture is identified
without subluxation or dislocation.

No other fractures are identified.

Moderate -severe degenerative changes at the first carpometacarpal
joint noted.
IMPRESSION: Comminuted intra-articular distal radial fracture.

## 2018-08-07 ENCOUNTER — Other Ambulatory Visit: Payer: Self-pay | Admitting: Physician Assistant

## 2019-03-22 ENCOUNTER — Encounter: Payer: Self-pay | Admitting: Internal Medicine

## 2019-04-15 ENCOUNTER — Other Ambulatory Visit: Payer: Self-pay

## 2019-04-15 ENCOUNTER — Encounter: Payer: Self-pay | Admitting: Internal Medicine

## 2019-04-15 ENCOUNTER — Ambulatory Visit (AMBULATORY_SURGERY_CENTER): Payer: Self-pay

## 2019-04-15 VITALS — Temp 96.6°F | Ht 64.0 in | Wt 162.0 lb

## 2019-04-15 DIAGNOSIS — Z86010 Personal history of colonic polyps: Secondary | ICD-10-CM

## 2019-04-15 NOTE — Progress Notes (Signed)
Denies allergies to eggs or soy products. Denies complication of anesthesia or sedation. Denies use of weight loss medication. Denies use of O2.   Emmi instructions given for colonoscopy.  Covid screening is scheduled for 04/24/19 @ 2:25 Pm.

## 2019-04-24 ENCOUNTER — Other Ambulatory Visit: Payer: Self-pay | Admitting: Internal Medicine

## 2019-04-24 ENCOUNTER — Ambulatory Visit (INDEPENDENT_AMBULATORY_CARE_PROVIDER_SITE_OTHER): Payer: Medicare HMO

## 2019-04-24 DIAGNOSIS — Z1159 Encounter for screening for other viral diseases: Secondary | ICD-10-CM

## 2019-04-25 LAB — SARS CORONAVIRUS 2 (TAT 6-24 HRS): SARS Coronavirus 2: NEGATIVE

## 2019-04-29 ENCOUNTER — Encounter: Payer: Self-pay | Admitting: Internal Medicine

## 2019-04-29 ENCOUNTER — Ambulatory Visit (AMBULATORY_SURGERY_CENTER): Payer: Medicare HMO | Admitting: Internal Medicine

## 2019-04-29 ENCOUNTER — Other Ambulatory Visit: Payer: Self-pay

## 2019-04-29 VITALS — BP 127/68 | HR 54 | Temp 98.5°F | Resp 13 | Ht 64.0 in | Wt 162.0 lb

## 2019-04-29 DIAGNOSIS — D127 Benign neoplasm of rectosigmoid junction: Secondary | ICD-10-CM

## 2019-04-29 DIAGNOSIS — D122 Benign neoplasm of ascending colon: Secondary | ICD-10-CM

## 2019-04-29 DIAGNOSIS — D123 Benign neoplasm of transverse colon: Secondary | ICD-10-CM

## 2019-04-29 DIAGNOSIS — Z86010 Personal history of colonic polyps: Secondary | ICD-10-CM | POA: Diagnosis present

## 2019-04-29 DIAGNOSIS — D126 Benign neoplasm of colon, unspecified: Secondary | ICD-10-CM

## 2019-04-29 HISTORY — PX: POLYPECTOMY: SHX149

## 2019-04-29 HISTORY — PX: COLONOSCOPY: SHX174

## 2019-04-29 MED ORDER — SODIUM CHLORIDE 0.9 % IV SOLN: 500.0000 mL | Freq: Once | INTRAVENOUS | Status: DC

## 2019-04-29 NOTE — Progress Notes (Signed)
Called to room to assist during endoscopic procedure.  Patient ID and intended procedure confirmed with present staff. Received instructions for my participation in the procedure from the performing physician.  

## 2019-04-29 NOTE — Progress Notes (Signed)
Pt's states no medical or surgical changes since previsit or office visit. 

## 2019-04-29 NOTE — Op Note (Signed)
[place] Patient Name: Dawn Hopkins Procedure Date: 04/29/2019 9:25 AM MRN: [id number] Endoscopist: Gatha Mayer , MD Age: 68 Referring MD:  Date of Birth: [birth year] Gender: Female Account #: [id number] Procedure:                Colonoscopy Indications:              High risk colon cancer surveillance: Personal                            history of Serrated Polyposis Syndrome Medicines:                Propofol per Anesthesia, Monitored Anesthesia Care Procedure:                Pre-Anesthesia Assessment:                           - Prior to the procedure, a History and Physical                            was performed, and patient medications and                            allergies were reviewed. The patient's tolerance of                            previous anesthesia was also reviewed. The risks                            and benefits of the procedure and the sedation                            options and risks were discussed with the patient.                            All questions were answered, and informed consent                            was obtained. Prior Anticoagulants: The patient has                            taken no previous anticoagulant or antiplatelet                            agents. ASA Grade Assessment: II - A patient with                            mild systemic disease. After reviewing the risks                            and benefits, the patient was deemed in                            satisfactory condition to undergo the procedure.  After obtaining informed consent, the colonoscope                            was passed under direct vision. Throughout the                            procedure, the patient's blood pressure, pulse, and                            oxygen saturations were monitored continuously. The                            Colonoscope was introduced through the anus and     advanced to the the cecum, identified by                            appendiceal orifice and ileocecal valve. The                            colonoscopy was performed without difficulty. The                            patient tolerated the procedure well. The quality                            of the bowel preparation was excellent. The bowel                            preparation used was Miralax via split dose                            instruction. The ileocecal valve, appendiceal                            orifice, and rectum were photographed. Scope In: 9:39:38 AM Scope Out: 10:12:16 AM Scope Withdrawal Time: 0 hours 29 minutes 58 seconds  Total Procedure Duration: 0 hours 32 minutes 38 seconds  Findings:                 The perianal and digital rectal examinations were                            normal.                           Five flat and sessile polyps were found in the                            transverse colon and ascending colon. The polyps                            were 3 to 15 mm in size. These polyps were removed                            with  a cold snare. Resection and retrieval were                            complete. Verification of patient identification                            for the specimen was done. Estimated blood loss was                            minimal.                           Seven sessile polyps were found in the                            recto-sigmoid colon and transverse colon. The                            polyps were diminutive in size. These polyps were                            removed with a cold biopsy forceps. Resection and                            retrieval were complete. Verification of patient                            identification for the specimen was done. Estimated                            blood loss was minimal.                           Multiple diverticula were found in the entire colon.                            The exam was otherwise without abnormality on                            direct and retroflexion views. Complications:            No immediate complications. Estimated Blood Loss:     Estimated blood loss was minimal. Impression:               - Five 3 to 15 mm polyps in the transverse colon                            and in the ascending colon, removed with a cold                            snare. Resected and retrieved.                           - Seven diminutive polyps at the recto-sigmoid  colon and in the transverse colon, removed with a                            cold biopsy forceps. Resected and retrieved.                           - Diverticulosis in the entire examined colon.                           - The examination was otherwise normal on direct                            and retroflexion views.                           - Personal history of colonic polyps. Serrated                            polyposis and hx adenomas Recommendation:           - Patient has a contact number available for                            emergencies. The signs and symptoms of potential                            delayed complications were discussed with the                            patient. Return to normal activities tomorrow.                            Written discharge instructions were provided to the                            patient.                           - Resume previous diet.                           - Continue present medications.                           - Repeat colonoscopy is recommended for                            surveillance. The colonoscopy date will be                            determined after pathology results from today's                            exam become available for review. Gatha Mayer, MD 04/29/2019 10:26:13 AM This report has been signed electronically.

## 2019-04-29 NOTE — Patient Instructions (Addendum)
I found and removed 10 tiny polyps and 2 medium-sized polyps. I will let you know pathology results and when to have another routine colonoscopy by mail and/or My Chart.  You also still have a condition called diverticulosis - common and not usually a problem. Please read the handout provided.  I appreciate the opportunity to care for you. Gatha Mayer, MD, [facility]  Please read handouts provided. Continue present medications. Await pathology results.      YOU HAD AN ENDOSCOPIC PROCEDURE TODAY AT [facility]:   Refer to the procedure report that was given to you for any specific questions about what was found during the examination.  If the procedure report does not answer your questions, please call your gastroenterologist to clarify.  If you requested that your care partner not be given the details of your procedure findings, then the procedure report has been included in a sealed envelope for you to review at your convenience later.  YOU SHOULD EXPECT: Some feelings of bloating in the abdomen. Passage of more gas than usual.  Walking can help get rid of the air that was put into your GI tract during the procedure and reduce the bloating. If you had a lower endoscopy (such as a colonoscopy or flexible sigmoidoscopy) you may notice spotting of blood in your stool or on the toilet paper. If you underwent a bowel prep for your procedure, you may not have a normal bowel movement for a few days.  Please Note:  You might notice some irritation and congestion in your nose or some drainage.  This is from the oxygen used during your procedure.  There is no need for concern and it should clear up in a day or so.  SYMPTOMS TO REPORT IMMEDIATELY:   Following lower endoscopy (colonoscopy or flexible sigmoidoscopy):  Excessive amounts of blood in the stool  Significant tenderness or worsening of abdominal pains  Swelling of the abdomen that is new, acute  Fever of 100F or  higher    For urgent or emergent issues, a gastroenterologist can be reached at any hour by calling [phone].   DIET:  We do recommend a small meal at first, but then you may proceed to your regular diet.  Drink plenty of fluids but you should avoid alcoholic beverages for 24 hours.  ACTIVITY:  You should plan to take it easy for the rest of today and you should NOT DRIVE or use heavy machinery until tomorrow (because of the sedation medicines used during the test).    FOLLOW UP: Our staff will call the number listed on your records 48-72 hours following your procedure to check on you and address any questions or concerns that you may have regarding the information given to you following your procedure. If we do not reach you, we will leave a message.  We will attempt to reach you two times.  During this call, we will ask if you have developed any symptoms of COVID 19. If you develop any symptoms (ie: fever, flu-like symptoms, shortness of breath, cough etc.) before then, please call [phone].  If you test positive for Covid 19 in the 2 weeks post procedure, please call and report this information to [place].    If any biopsies were taken you will be contacted by phone or by letter within the next 1-3 weeks.  Please call us at [phone] if you have not heard about the biopsies in 3 weeks.    SIGNATURES/CONFIDENTIALITY: You  and/or your care partner have signed paperwork which will be entered into your electronic medical record.  These signatures attest to the fact that that the information above on your After Visit Summary has been reviewed and is understood.  Full responsibility of the confidentiality of this discharge information lies with you and/or your care-partner.

## 2019-04-29 NOTE — Progress Notes (Signed)
Temperature taken by J.B., VS taken by C.W. 

## 2019-04-29 NOTE — Progress Notes (Signed)
To PACU, VSS. Report to [email or web address] 

## 2019-05-01 ENCOUNTER — Telehealth: Payer: Self-pay

## 2019-05-01 NOTE — Telephone Encounter (Signed)
First attempt follow up call to pt, lm on vm 

## 2019-05-01 NOTE — Telephone Encounter (Signed)
  Follow up Call-  Call back number 04/29/2019 04/19/2017  Post procedure Call Back phone  # [phone]  Permission to leave phone message Yes Yes  Some recent data might be hidden     Patient questions:  Do you have a fever, pain , or abdominal swelling? No. Pain Score  0 *  Have you tolerated food without any problems? Yes.    Have you been able to return to your normal activities? Yes.    Do you have any questions about your discharge instructions: Diet   No. Medications  No. Follow up visit  No.  Do you have questions or concerns about your Care? No.  Actions: * If pain score is 4 or above: No action needed, pain <4. 1. Have you developed a fever since your procedure? no  2.   Have you had an respiratory symptoms (SOB or cough) since your procedure? no  3.   Have you tested positive for COVID 19 since your procedure no  4.   Have you had any family members/close contacts diagnosed with the COVID 19 since your procedure?  no   If yes to any of these questions please route to Joylene John, RN and Alphonsa Gin, Therapist, sports.

## 2019-05-10 ENCOUNTER — Encounter: Payer: Self-pay | Admitting: Internal Medicine

## 2019-05-10 NOTE — Progress Notes (Signed)
12 polyps max 15 mm mix of adenomas, ssp, hyperplastic recall 2021

## 2020-06-23 ENCOUNTER — Ambulatory Visit (AMBULATORY_SURGERY_CENTER): Payer: Self-pay | Admitting: *Deleted

## 2020-06-23 ENCOUNTER — Other Ambulatory Visit: Payer: Self-pay

## 2020-06-23 VITALS — Ht 63.75 in | Wt 159.0 lb

## 2020-06-23 DIAGNOSIS — Z86010 Personal history of colonic polyps: Secondary | ICD-10-CM

## 2020-06-23 NOTE — Progress Notes (Signed)

## 2020-06-24 ENCOUNTER — Encounter: Payer: Self-pay | Admitting: Internal Medicine

## 2020-07-08 ENCOUNTER — Ambulatory Visit (AMBULATORY_SURGERY_CENTER): Payer: Medicare HMO | Admitting: Internal Medicine

## 2020-07-08 ENCOUNTER — Other Ambulatory Visit: Payer: Self-pay

## 2020-07-08 ENCOUNTER — Encounter: Payer: Self-pay | Admitting: Internal Medicine

## 2020-07-08 VITALS — BP 119/56 | HR 56 | Temp 96.6°F | Resp 11 | Ht 64.0 in | Wt 159.0 lb

## 2020-07-08 DIAGNOSIS — D125 Benign neoplasm of sigmoid colon: Secondary | ICD-10-CM | POA: Diagnosis not present

## 2020-07-08 DIAGNOSIS — D120 Benign neoplasm of cecum: Secondary | ICD-10-CM

## 2020-07-08 DIAGNOSIS — D124 Benign neoplasm of descending colon: Secondary | ICD-10-CM

## 2020-07-08 DIAGNOSIS — D126 Benign neoplasm of colon, unspecified: Secondary | ICD-10-CM

## 2020-07-08 DIAGNOSIS — D1391 Familial adenomatous polyposis: Secondary | ICD-10-CM

## 2020-07-08 DIAGNOSIS — Z86010 Personal history of colonic polyps: Secondary | ICD-10-CM

## 2020-07-08 MED ORDER — SODIUM CHLORIDE 0.9 % IV SOLN: 500.0000 mL | Freq: Once | INTRAVENOUS | Status: DC

## 2020-07-08 NOTE — Progress Notes (Signed)
Pt's states no medical or surgical changes since previsit or office visit. 

## 2020-07-08 NOTE — Progress Notes (Signed)
Called to room to assist during endoscopic procedure.  Patient ID and intended procedure confirmed with present staff. Received instructions for my participation in the procedure from the performing physician.  

## 2020-07-08 NOTE — Progress Notes (Signed)
Report given to PACU, vss 

## 2020-07-08 NOTE — Patient Instructions (Addendum)
Only 3 tiny polyps removed today.  Once I see the pathology will contact you with a recommendation about timing of next colonoscopy.  I appreciate the opportunity to care for you. Gatha Mayer, MD, FACG  YOU HAD AN ENDOSCOPIC PROCEDURE TODAY AT [facility]:   Refer to the procedure report that was given to you for any specific questions about what was found during the examination.  If the procedure report does not answer your questions, please call your gastroenterologist to clarify.  If you requested that your care partner not be given the details of your procedure findings, then the procedure report has been included in a sealed envelope for you to review at your convenience later.  YOU SHOULD EXPECT: Some feelings of bloating in the abdomen. Passage of more gas than usual.  Walking can help get rid of the air that was put into your GI tract during the procedure and reduce the bloating. If you had a lower endoscopy (such as a colonoscopy or flexible sigmoidoscopy) you may notice spotting of blood in your stool or on the toilet paper. If you underwent a bowel prep for your procedure, you may not have a normal bowel movement for a few days.  Please Note:  You might notice some irritation and congestion in your nose or some drainage.  This is from the oxygen used during your procedure.  There is no need for concern and it should clear up in a day or so.  SYMPTOMS TO REPORT IMMEDIATELY:   Following lower endoscopy (colonoscopy or flexible sigmoidoscopy):  Excessive amounts of blood in the stool  Significant tenderness or worsening of abdominal pains  Swelling of the abdomen that is new, acute  Fever of 100F or higher  For urgent or emergent issues, a gastroenterologist can be reached at any hour by calling [phone]. Do not use MyChart messaging for urgent concerns.    DIET:  We do recommend a small meal at first, but then you may proceed to your regular diet.  Drink  plenty of fluids but you should avoid alcoholic beverages for 24 hours.  ACTIVITY:  You should plan to take it easy for the rest of today and you should NOT DRIVE or use heavy machinery until tomorrow (because of the sedation medicines used during the test).    FOLLOW UP: Our staff will call the number listed on your records 48-72 hours following your procedure to check on you and address any questions or concerns that you may have regarding the information given to you following your procedure. If we do not reach you, we will leave a message.  We will attempt to reach you two times.  During this call, we will ask if you have developed any symptoms of COVID 19. If you develop any symptoms (ie: fever, flu-like symptoms, shortness of breath, cough etc.) before then, please call [phone].  If you test positive for Covid 19 in the 2 weeks post procedure, please call and report this information to [place].    If any biopsies were taken you will be contacted by phone or by letter within the next 1-3 weeks.  Please call us at [phone] if you have not heard about the biopsies in 3 weeks.    SIGNATURES/CONFIDENTIALITY: You and/or your care partner have signed paperwork which will be entered into your electronic medical record.  These signatures attest to the fact that that the information above on your After Visit Summary has been reviewed  and is understood.  Full responsibility of the confidentiality of this discharge information lies with you and/or your care-partner. 

## 2020-07-08 NOTE — Op Note (Signed)
[facility] Patient Name: Dawn Hopkins Procedure Date: 07/08/2020 11:26 AM MRN: [id number] Endoscopist: Iva Boop , MD Age: 70 Referring MD:  Date of Birth: 09-12-1950 Gender: Female Account #: [id number] Procedure:                Colonoscopy Indications:              High risk colon cancer surveillance: Personal                            history of familial adenomatous polyposis Medicines:                Propofol per Anesthesia, Monitored Anesthesia Care Procedure:                Pre-Anesthesia Assessment:                           - Prior to the procedure, a History and Physical                            was performed, and patient medications and                            allergies were reviewed. The patient's tolerance of                            previous anesthesia was also reviewed. The risks                            and benefits of the procedure and the sedation                            options and risks were discussed with the patient.                            All questions were answered, and informed consent                            was obtained. Prior Anticoagulants: The patient has                            taken no previous anticoagulant or antiplatelet                            agents. ASA Grade Assessment: II - A patient with                            mild systemic disease. After reviewing the risks                            and benefits, the patient was deemed in                            satisfactory condition to undergo the procedure.  After obtaining informed consent, the colonoscope                            was passed under direct vision. Throughout the                            procedure, the patient's blood pressure, pulse, and                            oxygen saturations were monitored continuously. The                            Olympus PCF-H190DL (#[id number]) Colonoscope was                             introduced through the anus and advanced to the the                            cecum, identified by appendiceal orifice and                            ileocecal valve. The colonoscopy was performed                            without difficulty. The patient tolerated the                            procedure well. The quality of the bowel                            preparation was excellent. The ileocecal valve,                            appendiceal orifice, and rectum were photographed.                            The bowel preparation used was Miralax via split                            dose instruction. Scope In: 11:39:09 AM Scope Out: 11:58:05 AM Scope Withdrawal Time: 0 hours 14 minutes 49 seconds  Total Procedure Duration: 0 hours 18 minutes 56 seconds  Findings:                 The perianal and digital rectal examinations were                            normal.                           Two flat polyps were found in the sigmoid colon and                            descending colon. The polyps were diminutive in  size. These polyps were removed with a cold snare.                            Resection and retrieval were complete. Verification                            of patient identification for the specimen was                            done. Estimated blood loss was minimal.                           A diminutive polyp was found in the cecum. The                            polyp was flat. The polyp was removed with a cold                            biopsy forceps. Resection and retrieval were                            complete. Verification of patient identification                            for the specimen was done. Estimated blood loss was                            minimal.                           Multiple small and large-mouthed diverticula were                            found in the sigmoid colon, descending colon,                             transverse colon and ascending colon.                           The exam was otherwise without abnormality on                            direct and retroflexion views. Complications:            No immediate complications. Estimated Blood Loss:     Estimated blood loss was minimal. Impression:               - Two diminutive polyps in the sigmoid colon and in                            the descending colon, removed with a cold snare.                            Resected and retrieved.                           -  One diminutive polyp in the cecum, removed with a                            cold biopsy forceps. Resected and retrieved.                           - Diverticulosis in the sigmoid colon, in the                            descending colon, in the transverse colon and in                            the ascending colon.                           - The examination was otherwise normal on direct                            and retroflexion views.                           - Serrated polyposis syndrome (+ adenomas) Recommendation:           - Patient has a contact number available for                            emergencies. The signs and symptoms of potential                            delayed complications were discussed with the                            patient. Return to normal activities tomorrow.                            Written discharge instructions were provided to the                            patient.                           - Resume previous diet.                           - Continue present medications.                           - Repeat colonoscopy is recommended for                            surveillance. The colonoscopy date will be                            determined after pathology results from today's  exam become available for review. Iva Boop, MD 07/08/2020 12:10:14 PM This report has been signed electronically.

## 2020-07-10 ENCOUNTER — Telehealth: Payer: Self-pay

## 2020-07-10 NOTE — Telephone Encounter (Signed)
  Follow up Call-  Call back number 07/08/2020 04/29/2019  Post procedure Call Back phone  # [phone]  Permission to leave phone message Yes Yes  Some recent data might be hidden     Patient questions:  Do you have a fever, pain , or abdominal swelling? No. Pain Score  0 *  Have you tolerated food without any problems? Yes.    Have you been able to return to your normal activities? Yes.    Do you have any questions about your discharge instructions: Diet   No. Medications  No. Follow up visit  No.  Do you have questions or concerns about your Care? No.  Actions: * If pain score is 4 or above: No action needed, pain <4.  1. Have you developed a fever since your procedure? no  2.   Have you had an respiratory symptoms (SOB or cough) since your procedure? no  3.   Have you tested positive for COVID 19 since your procedure no  4.   Have you had any family members/close contacts diagnosed with the COVID 19 since your procedure?  no   If yes to any of these questions please route to Joylene John, RN and Joella Prince, RN

## 2020-07-17 ENCOUNTER — Encounter: Payer: Self-pay | Admitting: Internal Medicine

## 2022-05-24 ENCOUNTER — Encounter: Payer: Self-pay | Admitting: Internal Medicine

## 2022-06-27 ENCOUNTER — Ambulatory Visit (AMBULATORY_SURGERY_CENTER): Payer: Medicare HMO

## 2022-06-27 VITALS — Ht 63.0 in | Wt 149.0 lb

## 2022-06-27 DIAGNOSIS — Z860100 Personal history of colon polyps, unspecified: Secondary | ICD-10-CM

## 2022-06-27 NOTE — Progress Notes (Signed)

## 2022-07-08 ENCOUNTER — Encounter: Payer: Self-pay | Admitting: Internal Medicine

## 2022-07-11 ENCOUNTER — Encounter: Payer: Self-pay | Admitting: Certified Registered Nurse Anesthetist

## 2022-07-18 ENCOUNTER — Ambulatory Visit (AMBULATORY_SURGERY_CENTER): Payer: Medicare HMO | Admitting: Internal Medicine

## 2022-07-18 ENCOUNTER — Encounter: Payer: Self-pay | Admitting: Internal Medicine

## 2022-07-18 VITALS — BP 131/64 | HR 55 | Temp 97.5°F | Resp 16 | Ht 63.75 in | Wt 149.0 lb

## 2022-07-18 DIAGNOSIS — Z86010 Personal history of colonic polyps: Secondary | ICD-10-CM

## 2022-07-18 DIAGNOSIS — Z09 Encounter for follow-up examination after completed treatment for conditions other than malignant neoplasm: Secondary | ICD-10-CM | POA: Diagnosis present

## 2022-07-18 DIAGNOSIS — D122 Benign neoplasm of ascending colon: Secondary | ICD-10-CM

## 2022-07-18 DIAGNOSIS — D124 Benign neoplasm of descending colon: Secondary | ICD-10-CM | POA: Diagnosis not present

## 2022-07-18 DIAGNOSIS — D126 Benign neoplasm of colon, unspecified: Secondary | ICD-10-CM | POA: Diagnosis not present

## 2022-07-18 DIAGNOSIS — D1391 Familial adenomatous polyposis: Secondary | ICD-10-CM

## 2022-07-18 DIAGNOSIS — K635 Polyp of colon: Secondary | ICD-10-CM | POA: Diagnosis not present

## 2022-07-18 MED ORDER — SODIUM CHLORIDE 0.9 % IV SOLN: 500.0000 mL | Freq: Once | INTRAVENOUS | Status: DC

## 2022-07-18 NOTE — Progress Notes (Signed)
Report given to PACU, vss 

## 2022-07-18 NOTE — Progress Notes (Signed)
Called to room to assist during endoscopic procedure.  Patient ID and intended procedure confirmed with present staff. Received instructions for my participation in the procedure from the performing physician.  

## 2022-07-18 NOTE — Patient Instructions (Addendum)
I removed 2 tiny polyps today. I will let you know pathology results and when to have another routine colonoscopy by mail and/or My Chart.  I appreciate the opportunity to care for you. Gatha Mayer, MD, [facility] Recommendation: - Patient has a contact number available for                            emergencies. The signs and symptoms of potential                            delayed complications were discussed with the                            patient. Return to normal activities tomorrow.                            Written discharge instructions were provided to the                            patient.                           - Resume previous diet.                           - Continue present medications.                           - Repeat colonoscopy is recommended for                            surveillance. The colonoscopy date will be                            determined after pathology results from today's                            exam become available for review.  Handouts on diverticulosis, polyps and hemorrhoids given.  YOU HAD AN ENDOSCOPIC PROCEDURE TODAY AT [facility]:   Refer to the procedure report that was given to you for any specific questions about what was found during the examination.  If the procedure report does not answer your questions, please call your gastroenterologist to clarify.  If you requested that your care partner not be given the details of your procedure findings, then the procedure report has been included in a sealed envelope for you to review at your convenience later.  YOU SHOULD EXPECT: Some feelings of bloating in the abdomen. Passage of more gas than usual.  Walking can help get rid of the air that was put into your GI tract during the procedure and reduce the bloating. If you had a lower endoscopy (such as a colonoscopy or flexible sigmoidoscopy) you may notice spotting of blood in your stool or on the toilet paper. If you  underwent a bowel prep for your procedure, you may not have a normal bowel movement for a few days.  Please Note:  You might notice some irritation and congestion in your nose or some  drainage.  This is from the oxygen used during your procedure.  There is no need for concern and it should clear up in a day or so.  SYMPTOMS TO REPORT IMMEDIATELY:  Following lower endoscopy (colonoscopy or flexible sigmoidoscopy):  Excessive amounts of blood in the stool  Significant tenderness or worsening of abdominal pains  Swelling of the abdomen that is new, acute  Fever of 100F or higher  For urgent or emergent issues, a gastroenterologist can be reached at any hour by calling [phone]. Do not use MyChart messaging for urgent concerns.   DIET:  We do recommend a small meal at first, but then you may proceed to your regular diet.  Drink plenty of fluids but you should avoid alcoholic beverages for 24 hours.  ACTIVITY:  You should plan to take it easy for the rest of today and you should NOT DRIVE or use heavy machinery until tomorrow (because of the sedation medicines used during the test).    FOLLOW UP: Our staff will call the number listed on your records the next business day following your procedure.  We will call around 7:15- 8:00 am to check on you and address any questions or concerns that you may have regarding the information given to you following your procedure. If we do not reach you, we will leave a message.     If any biopsies were taken you will be contacted by phone or by letter within the next 1-3 weeks.  Please call us at [phone] if you have not heard about the biopsies in 3 weeks.   SIGNATURES/CONFIDENTIALITY: You and/or your care partner have signed paperwork which will be entered into your electronic medical record.  These signatures attest to the fact that that the information above on your After Visit Summary has been reviewed and is understood.  Full responsibility  of the confidentiality of this discharge information lies with you and/or your care-partner.

## 2022-07-18 NOTE — Progress Notes (Signed)
Pt's states no medical or surgical changes since previsit or office visit. 

## 2022-07-18 NOTE — Progress Notes (Signed)
[facility] History and Physical   Primary Care Physician:  Pcp, No   Reason for Procedure:   Serrated polyposis syndrome  Plan:    colonoscopy     HPI: Dawn Hopkins is a 72 y.o. female for f/u polyposis  Negative genetics testing  11/2002 - 1 cm tubulovillous adenoma of  cecum w/HGD 01/2004 7 mm adenoma of rectum  2011 large flat hyperplastic polyp 01/2012 - 5 diminutive adenomas 03/19/2015 19 diminutive polyps 12 descending hyperplastic and 7 were adenomas/ssp from proximal colon -  04/19/2017 4 adenomas 04/29/2019 12 polyps max 15 mm mix adenomas, ssp's hyperplastic 07/08/2020 3 diminutive polyps 2 hyperplastic and one adenoma recall 2024 Past Medical History:  Diagnosis Date   Hyperlipidemia    Hypertension    Hyperthyroidism    Personal history of colonic polyps - 18 total ? polyposis 11/26/2010   11/2002 - 1 cm tubulovillous adenoma of  cecum w/HGD 01/2004 7 mm adenoma of rectum  2011 large flat hyperplastic polyp 01/2012 - 5 diminutive adenomas 03/19/2015 19 diminutive polyps 12 descending hyperplastic and 7 were adenomas/ssp from proximal colon -  04/19/2017 4 adenomas recall 2020     Serrated polyposis syndrome 11/26/2010   11/2002 - 1 cm tubulovillous adenoma of  cecum w/HGD 01/2004 7 mm adenoma of rectum  2011 large flat hyperplastic polyp 01/2012 - 5 diminutive adenomas 03/19/2015 19 diminutive polyps 12 descending hyperplastic and 7 were adenomas/ssp from proximal colon -  04/19/2017 4 adenomas 04/29/2019 12 polyps max 15 mm      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 Authorizing Provider  Cholecalciferol (VITAMIN D PO) Take by mouth daily.   Yes [provider]  Flaxseed, Linseed, (FLAXSEED OIL PO) Take by mouth daily.   Yes [provider]  Glucosamine 500 MG CAPS Take by mouth daily.   Yes [provider]  lisinopril (ZESTRIL) 20 MG tablet Take 20 mg by mouth daily.   Yes [provider]  OVER THE COUNTER MEDICATION Biotin 2500 mg two gummies one time daily.   Yes [provider]  OVER THE COUNTER MEDICATION Potassium 99 mg one table daily.   Yes [provider]  rosuvastatin (CRESTOR) 20 MG tablet Take 20 mg by mouth daily.   Yes [provider]  valACYclovir (VALTREX) 1000 MG tablet Take by mouth. Patient not taking: Reported on 07/18/2022 06/17/19   [provider]    Current Outpatient Medications  Medication Sig Dispense Refill   Cholecalciferol (VITAMIN D PO) Take by mouth daily.     Flaxseed, Linseed, (FLAXSEED OIL PO) Take by mouth daily.     Glucosamine 500 MG CAPS Take by mouth daily.     lisinopril (ZESTRIL) 20 MG tablet Take 20  mg by mouth daily.     OVER THE COUNTER MEDICATION Biotin 2500 mg two gummies one time daily.     OVER THE COUNTER MEDICATION Potassium 99 mg one table daily.     rosuvastatin (CRESTOR) 20 MG tablet Take 20 mg by mouth daily.     valACYclovir (VALTREX) 1000 MG tablet Take by mouth. (Patient not taking: Reported on 07/18/2022)     Current Facility-Administered Medications  Medication Dose Route Frequency Provider Last Rate Last Admin   0.9 %  sodium chloride infusion  500 mL Intravenous Once Gatha Mayer, MD        Allergies as of 07/18/2022   (No Known Allergies)     Family History  Problem Relation Age of Onset   Breast cancer Mother        dx in her 18s   Alzheimer's disease Father    Cancer Paternal Uncle    Colon cancer Maternal Grandfather        dx in his 55s   Brain cancer Daughter 39       oligodendroglioma   Colon polyps Son    Stomach cancer Neg Hx    Esophageal cancer Neg Hx    Rectal cancer Neg Hx     Social History   Socioeconomic History   Marital status: Divorced    Spouse name: Not on file   Number of children: 1   Years of education: Not on file   Highest education level: Not on file  Occupational History   Occupation: Administrator: [employer]  Tobacco Use   Smoking status: Every Day    Packs/day: 1.00    Types: Cigarettes   Smokeless tobacco: Never   Tobacco comments:    cut back to 1/2 pack per day  Vaping Use   Vaping Use: Never used  Substance and Sexual Activity   Alcohol use: Yes    Comment: 5 times a week per pt   Drug use: No   Sexual activity: Not on file  Other Topics Concern   Not on file  Social History Narrative   Not on file   Social Determinants of Health   Financial Resource Strain: Not on file  Food Insecurity: Not on file  Transportation Needs: Not on file  Physical Activity: Not on file  Stress: Not on file  Social Connections: Not on file  Intimate Partner Violence: Not on file    Review of Systems:  All other review of systems negative except as mentioned in the HPI.  Physical Exam: Vital signs BP 129/60   Pulse 62   Temp (!) 97.5 F (36.4 C)   Resp 16   Ht 5' 3.75" (1.619 m)   Wt 149 lb (67.6 kg)   SpO2 93%   BMI 25.78 kg/m   General:   Alert,  Well-developed, well-nourished, pleasant and cooperative in NAD Lungs:  Clear throughout to auscultation.   Heart:  Regular rate and rhythm; no murmurs, clicks, rubs,  or gallops. Abdomen:  Soft, nontender and nondistended. Normal bowel sounds.   Neuro/Psych:  Alert and cooperative. Normal mood and  affect. A and O x 3   [name], MD, [facility] [phone] (pager) 07/18/2022 3:03 PM@

## 2022-07-18 NOTE — Op Note (Signed)
[facility] Patient Name: Dawn Hopkins Procedure Date: 07/18/2022 2:28 PM MRN: [id number] Endoscopist: Gatha Mayer , MD, [id number] Age: 72 Referring MD:  Date of Birth: November 30, 1950 Gender: Female Account #: [id number] Procedure:                Colonoscopy Indications:              Surveillance: History of numerous adenomas and                            sessile serrtaed lesions (attenuated polyposis);                            last colonoscopy (< 3 yrs) Medicines:                Monitored Anesthesia Care Procedure:                Pre-Anesthesia Assessment:                           - Prior to the procedure, a History and Physical                            was performed, and patient medications and                            allergies were reviewed. The patient's tolerance of                            previous anesthesia was also reviewed. The risks                            and benefits of the procedure and the sedation                            options and risks were discussed with the patient.                            All questions were answered, and informed consent                            was obtained. Prior Anticoagulants: The patient has                            taken no anticoagulant or antiplatelet agents. ASA                            Grade Assessment: II - A patient with mild systemic                            disease. After reviewing the risks and benefits,                            the patient was deemed in satisfactory condition to  undergo the procedure.                           After obtaining informed consent, the colonoscope                            was passed under direct vision. Throughout the                            procedure, the patient's blood pressure, pulse, and                            oxygen saturations were monitored continuously. The                            [id number]-HQ190L Colonoscope [id number]  was introduced                            through the anus and advanced to the the cecum,                            identified by appendiceal orifice and ileocecal                            valve. The colonoscopy was performed without                            difficulty. The patient tolerated the procedure                            well. The quality of the bowel preparation was                            good. The ileocecal valve, appendiceal orifice, and                            rectum were photographed. Scope In: 2:44:08 PM Scope Out: 2:59:17 PM Scope Withdrawal Time: 0 hours 11 minutes 59 seconds  Total Procedure Duration: 0 hours 15 minutes 9 seconds  Findings:                 The perianal and digital rectal examinations were                            normal.                           Two sessile polyps were found in the descending                            colon and ascending colon. The polyps were                            diminutive in size. These polyps were removed with  a cold snare. Resection and retrieval were                            complete. Verification of patient identification                            for the specimen was done. Estimated blood loss was                            minimal.                           Multiple diverticula were found in the sigmoid                            colon, descending colon and transverse colon.                           Anal papilla(e) were hypertrophied.                           The exam was otherwise without abnormality on                            direct and retroflexion views. Complications:            No immediate complications. Estimated Blood Loss:     Estimated blood loss was minimal. Impression:               - Two diminutive polyps in the descending colon and                            in the ascending colon, removed with a cold snare.                            Resected and  retrieved.                           - Diverticulosis in the sigmoid colon, in the                            descending colon and in the transverse colon.                           - Anal papilla(e) were hypertrophied.                           - The examination was otherwise normal on direct                            and retroflexion views.                           - Personal history of colonic polyps =- attenuated  polyposis w/ adenomas and serrated lesions                            (negative genetic testing)                           11/2002 - 1 cm tubulovillous adenoma of                           cecum w/HGD                           01/2004 7 mm adenoma of rectum                           2011 large flat hyperplastic polyp                           01/2012 - 5 diminutive adenomas                           03/19/2015 19 diminutive polyps 12 descending                            hyperplastic and 7 were adenomas/ssp from proximal                            colon -                           04/19/2017 4 adenomas                           04/29/2019 12 polyps max 15 mm mix adenomas, ssp's                            hyperplastic                           07/08/2020 3 diminutive polyps 2 hyperplastic and                            one adenoma Recommendation:           - Patient has a contact number available for                            emergencies. The signs and symptoms of potential                            delayed complications were discussed with the                            patient. Return to normal activities tomorrow.                            Written discharge instructions were provided to the  patient.                           - Resume previous diet.                           - Continue present medications.                           - Repeat colonoscopy is recommended for                            surveillance. The  colonoscopy date will be                            determined after pathology results from today's                            exam become available for review. Gatha Mayer, MD 07/18/2022 3:10:19 PM This report has been signed electronically.

## 2022-07-19 ENCOUNTER — Telehealth: Payer: Self-pay

## 2022-07-19 NOTE — Telephone Encounter (Signed)
Follow up call to pt, lm for pt to call if having any difficulty with normal activities or eating and drinking.  Also to call if any other questions or concerns.  

## 2022-08-01 ENCOUNTER — Encounter: Payer: Self-pay | Admitting: Internal Medicine

## 2022-08-01 DIAGNOSIS — D1391 Familial adenomatous polyposis: Secondary | ICD-10-CM
